# Patient Record
Sex: Female | Born: 1960
Health system: Southern US, Community
[De-identification: ages and names within clinical notes are randomized; demographics above are authoritative.]

## PROBLEM LIST (undated history)

## (undated) DIAGNOSIS — R519 Headache, unspecified: Secondary | ICD-10-CM

## (undated) DIAGNOSIS — R51 Headache: Secondary | ICD-10-CM

## (undated) DIAGNOSIS — I10 Essential (primary) hypertension: Secondary | ICD-10-CM

## (undated) DIAGNOSIS — G43909 Migraine, unspecified, not intractable, without status migrainosus: Secondary | ICD-10-CM

## (undated) DIAGNOSIS — N390 Urinary tract infection, site not specified: Secondary | ICD-10-CM

## (undated) DIAGNOSIS — B019 Varicella without complication: Secondary | ICD-10-CM

## (undated) DIAGNOSIS — Z9189 Other specified personal risk factors, not elsewhere classified: Secondary | ICD-10-CM

## (undated) HISTORY — DX: Other specified personal risk factors, not elsewhere classified: Z91.89

## (undated) HISTORY — DX: Headache: R51

## (undated) HISTORY — PX: SPINE SURGERY: SHX786

## (undated) HISTORY — DX: Headache, unspecified: R51.9

## (undated) HISTORY — DX: Urinary tract infection, site not specified: N39.0

## (undated) HISTORY — DX: Migraine, unspecified, not intractable, without status migrainosus: G43.909

## (undated) HISTORY — DX: Varicella without complication: B01.9

## (undated) HISTORY — DX: Essential (primary) hypertension: I10

---

## 2005-06-28 HISTORY — PX: NECK SURGERY: SHX720

## 2005-06-28 HISTORY — PX: BACK SURGERY: SHX140

## 2007-06-29 HISTORY — PX: BACK SURGERY: SHX140

## 2018-03-02 ENCOUNTER — Ambulatory Visit: Payer: Self-pay | Admitting: Family Medicine

## 2018-03-23 ENCOUNTER — Ambulatory Visit (INDEPENDENT_AMBULATORY_CARE_PROVIDER_SITE_OTHER): Payer: Medicare Other | Admitting: Family Medicine

## 2018-03-23 ENCOUNTER — Encounter: Payer: Self-pay | Admitting: Family Medicine

## 2018-03-23 VITALS — BP 133/86 | HR 78 | Temp 98.3°F | Ht 69.0 in | Wt 138.6 lb

## 2018-03-23 DIAGNOSIS — N3000 Acute cystitis without hematuria: Secondary | ICD-10-CM | POA: Diagnosis not present

## 2018-03-23 DIAGNOSIS — M545 Low back pain, unspecified: Secondary | ICD-10-CM | POA: Insufficient documentation

## 2018-03-23 DIAGNOSIS — E2740 Unspecified adrenocortical insufficiency: Secondary | ICD-10-CM | POA: Diagnosis not present

## 2018-03-23 DIAGNOSIS — G8929 Other chronic pain: Secondary | ICD-10-CM | POA: Diagnosis not present

## 2018-03-23 DIAGNOSIS — M47817 Spondylosis without myelopathy or radiculopathy, lumbosacral region: Secondary | ICD-10-CM | POA: Insufficient documentation

## 2018-03-23 DIAGNOSIS — Z23 Encounter for immunization: Secondary | ICD-10-CM | POA: Diagnosis not present

## 2018-03-23 HISTORY — DX: Spondylosis without myelopathy or radiculopathy, lumbosacral region: M47.817

## 2018-03-23 LAB — POCT URINALYSIS DIPSTICK
Bilirubin, UA: NEGATIVE
Blood, UA: NEGATIVE
Glucose, UA: NEGATIVE
Ketones, UA: NEGATIVE
Leukocytes, UA: NEGATIVE
Nitrite, UA: POSITIVE
PH UA: 6 (ref 5.0–8.0)
Protein, UA: NEGATIVE
Spec Grav, UA: 1.025 (ref 1.010–1.025)
UROBILINOGEN UA: 0.2 U/dL

## 2018-03-23 MED ORDER — SULFAMETHOXAZOLE-TRIMETHOPRIM 800-160 MG PO TABS: 1.0000 | ORAL_TABLET | Freq: Two times a day (BID) | ORAL | 0 refills | Status: AC

## 2018-03-23 NOTE — Patient Instructions (Addendum)
Stay hydrated.   Warning signs/symptoms: Uncontrollable nausea/vomiting, fevers, worsening symptoms despite treatment, confusion.  Give us around 2 business days to get culture back to you.  If you do not hear anything about your referral in the next 1-2 weeks, call our office and ask for an update.  Let us know if you need anything.  
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
## 2018-03-24 ENCOUNTER — Encounter: Payer: Self-pay | Admitting: Family Medicine

## 2018-03-24 ENCOUNTER — Other Ambulatory Visit (INDEPENDENT_AMBULATORY_CARE_PROVIDER_SITE_OTHER): Payer: Medicare Other

## 2018-03-24 DIAGNOSIS — E2740 Unspecified adrenocortical insufficiency: Secondary | ICD-10-CM | POA: Diagnosis not present

## 2018-03-24 LAB — CORTISOL: Cortisol, Plasma: 12.5 ug/dL

## 2018-03-25 LAB — URINE CULTURE
MICRO NUMBER: 91158355
SPECIMEN QUALITY: ADEQUATE

## 2018-03-27 ENCOUNTER — Other Ambulatory Visit: Payer: Self-pay | Admitting: Family Medicine

## 2018-03-27 MED ORDER — CEPHALEXIN 500 MG PO CAPS: 500.0000 mg | ORAL_CAPSULE | Freq: Two times a day (BID) | ORAL | 0 refills | Status: DC

## 2018-03-29 ENCOUNTER — Telehealth: Payer: Self-pay | Admitting: Family Medicine

## 2018-03-29 ENCOUNTER — Telehealth: Payer: Self-pay | Admitting: *Deleted

## 2018-03-29 NOTE — Telephone Encounter (Signed)
Pap smear 03/12/15.  DEXA  Mammogram- hx of breast cancer Colonoscopy 2017 I did not see any immunizations or HIV/Hep C screening info in her records. RE- please update her chart.

## 2018-03-29 NOTE — Telephone Encounter (Signed)
Received Medical records from [facility] [place]; forwarded to provider/SLS 10/02

## 2018-03-31 ENCOUNTER — Telehealth: Payer: Self-pay

## 2018-03-31 MED ORDER — MELOXICAM 15 MG PO TABS: 15.0000 mg | ORAL_TABLET | Freq: Every day | ORAL | 0 refills | Status: DC

## 2018-03-31 NOTE — Telephone Encounter (Signed)
Copied from CRM [id number]. Topic: General - Other >> Mar 24, 2018  1:10 PM Mcneil, Ja-Kwan wrote: Reason for CRM: Pt states she has been experiencing a headache which is a side effects of the sulfamethoxazole-trimethoprim (BACTRIM DS,SEPTRA DS) 800-160 MG tablet. Pt states it advises to contact doctor if side effects continue. Pt states she has not been able to get rid of the headache and she would like a call back from the doctor or nurse. Cb# [phone]

## 2018-03-31 NOTE — Addendum Note (Signed)
Addended by: Radene Gunning on: 03/31/2018 12:31 PM   Modules accepted: Orders

## 2018-03-31 NOTE — Telephone Encounter (Signed)
done

## 2018-03-31 NOTE — Telephone Encounter (Signed)
I will call something in to help with HA, if no improvement consider urgent care or following up in office on Mon. TY.

## 2018-03-31 NOTE — Telephone Encounter (Signed)
Patient informed of PCP instructions. 

## 2018-05-05 ENCOUNTER — Encounter: Payer: Self-pay | Admitting: Physical Medicine & Rehabilitation

## 2018-05-05 ENCOUNTER — Encounter: Payer: Medicare Other | Attending: Physical Medicine & Rehabilitation

## 2018-05-05 ENCOUNTER — Ambulatory Visit (HOSPITAL_BASED_OUTPATIENT_CLINIC_OR_DEPARTMENT_OTHER): Payer: Medicare Other | Admitting: Physical Medicine & Rehabilitation

## 2018-05-05 VITALS — BP 147/93 | HR 70 | Ht 69.0 in | Wt 141.6 lb

## 2018-05-05 DIAGNOSIS — F17210 Nicotine dependence, cigarettes, uncomplicated: Secondary | ICD-10-CM | POA: Insufficient documentation

## 2018-05-05 DIAGNOSIS — G243 Spasmodic torticollis: Secondary | ICD-10-CM | POA: Insufficient documentation

## 2018-05-05 DIAGNOSIS — M545 Low back pain: Secondary | ICD-10-CM | POA: Diagnosis not present

## 2018-05-05 DIAGNOSIS — M961 Postlaminectomy syndrome, not elsewhere classified: Secondary | ICD-10-CM | POA: Insufficient documentation

## 2018-05-05 DIAGNOSIS — G8929 Other chronic pain: Secondary | ICD-10-CM | POA: Insufficient documentation

## 2018-05-05 DIAGNOSIS — R200 Anesthesia of skin: Secondary | ICD-10-CM | POA: Diagnosis not present

## 2018-05-05 MED ORDER — GABAPENTIN 300 MG PO CAPS: 300.0000 mg | ORAL_CAPSULE | Freq: Three times a day (TID) | ORAL | 1 refills | Status: DC
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
## 2018-05-05 NOTE — Progress Notes (Signed)
Patient states wants to remain off narcotic pain medication.  No UDS, Opioid substance agreement,, Opioid Consent Form, or opioid score was accquired

## 2018-06-02 ENCOUNTER — Ambulatory Visit: Payer: Medicare Other | Admitting: Physical Medicine & Rehabilitation

## 2018-06-09 ENCOUNTER — Encounter: Payer: Self-pay | Admitting: Family Medicine

## 2018-06-09 ENCOUNTER — Ambulatory Visit (INDEPENDENT_AMBULATORY_CARE_PROVIDER_SITE_OTHER): Payer: Medicare Other | Admitting: Family Medicine

## 2018-06-09 VITALS — BP 108/64 | HR 89 | Temp 98.1°F | Ht 69.0 in | Wt 141.0 lb

## 2018-06-09 DIAGNOSIS — N3001 Acute cystitis with hematuria: Secondary | ICD-10-CM | POA: Insufficient documentation

## 2018-06-09 HISTORY — DX: Acute cystitis with hematuria: N30.01

## 2018-06-09 LAB — POC URINALSYSI DIPSTICK (AUTOMATED)
Bilirubin, UA: NEGATIVE
GLUCOSE UA: NEGATIVE
KETONES UA: NEGATIVE
Nitrite, UA: POSITIVE
Protein, UA: POSITIVE — AB
SPEC GRAV UA: 1.025 (ref 1.010–1.025)
Urobilinogen, UA: 0.2 E.U./dL
pH, UA: 6 (ref 5.0–8.0)

## 2018-06-09 MED ORDER — CEPHALEXIN 500 MG PO CAPS: 500.0000 mg | ORAL_CAPSULE | Freq: Two times a day (BID) | ORAL | 0 refills | Status: AC

## 2018-06-09 NOTE — Progress Notes (Signed)
Pre visit review using our clinic review tool, if applicable. No additional management support is needed unless otherwise documented below in the visit note. 

## 2018-06-09 NOTE — Patient Instructions (Signed)
Stay hydrated.   Warning signs/symptoms: Uncontrollable nausea/vomiting, fevers, worsening symptoms despite treatment, confusion.  Give us around 2 business days to get culture back to you.  Let us know if you need anything. 

## 2018-06-09 NOTE — Progress Notes (Signed)
Chief Complaint  Patient presents with  . Dysuria  . Urinary Frequency    [name] is a 57 y.o. female here for possible UTI.  Duration: 2 days. Symptoms: urinary frequency, hematuria, urinary retention, flank pain on L (typical w UTI's and dysuria Denies: fever, nausea, vomiting and abd pain, vaginal discharge Hx of recurrent UTI? Yes  ROS:  Constitutional: denies fever GU: As noted in HPI  Past Medical History:  Diagnosis Date  . Chicken pox   . Frequent headaches   . History of fainting spells of unknown cause   . Migraines   . Urinary tract infection     BP 108/64 (BP Location: Left Arm, Patient Position: Sitting, Cuff Size: Normal)   Pulse 89   Temp 98.1 F (36.7 C) (Oral)   Ht 5\' 9"  (1.753 m)   Wt 141 lb (64 kg)   LMP  (LMP Unknown)   SpO2 98%   BMI 20.82 kg/m  General: Awake, alert, appears stated age Heart: RRR Lungs: CTAB, normal respiratory effort, no accessory muscle usage Abd: BS+, soft, NT, ND, no masses or organomegaly MSK: No CVA tenderness, neg Lloyd's sign Psych: Age appropriate judgment and insight  Acute cystitis with hematuria - Plan: POCT Urinalysis Dipstick (Automated), Urine Culture, cephALEXin (KEFLEX) 500 MG capsule  Orders as above. Ck cx.  Stay hydrated. Seek immediate care if pt starts to develop fevers, new/worsening symptoms, uncontrollable N/V. F/u prn. The patient voiced understanding and agreement to the plan.  Jilda Rocheicholas Paul Mount MorrisWendling, DO 06/09/18 2:29 PM

## 2018-06-10 LAB — URINE CULTURE
MICRO NUMBER:: 91495518
SPECIMEN QUALITY:: ADEQUATE

## 2018-06-27 NOTE — Progress Notes (Signed)
Subjective:   Brenda Curry is a 57 y.o. female who presents for an Initial Medicare Annual Wellness Visit.  Review of Systems   No ROS.  Medicare Wellness Visit. Additional risk factors are reflected in the social history. Cardiac Risk Factors include: advanced age (>18men, >98 women) Sleep patterns: very restless sleep.  Home Safety/Smoke Alarms: Feels safe in home. Smoke alarms in place. Lives in 2 story home. Nephew lives with her.     Female:   Pap- will discuss with PCP    Mammo- order             CCS- Will order Cologuard at pt request     Objective:    Today's Vitals   06/29/18 1008  BP: 110/60  Pulse: 80  SpO2: 98%  Weight: 138 lb 12.8 oz (63 kg)  Height: 5\' 9"  (1.753 m)   Body mass index is 20.5 kg/m.  Advanced Directives 06/29/2018  Does Patient Have a Medical Advance Directive? No  Would patient like information on creating a medical advance directive? No - Patient declined    Current Medications (verified) Outpatient Encounter Medications as of 06/29/2018  Medication Sig  . meloxicam (MOBIC) 15 MG tablet Take 1 tablet (15 mg total) by mouth daily.  Marland Kitchen gabapentin (NEURONTIN) 300 MG capsule Take 1 capsule (300 mg total) by mouth 3 (three) times daily.  Marland Kitchen ibuprofen (ADVIL,MOTRIN) 200 MG tablet Take 200 mg by mouth every 6 (six) hours as needed.   No facility-administered encounter medications on file as of 06/29/2018.     Allergies (verified) Macrobid [nitrofurantoin macrocrystal] and Tape   History: Past Medical History:  Diagnosis Date  . Chicken pox   . Frequent headaches   . History of fainting spells of unknown cause   . Migraines   . Urinary tract infection    Past Surgical History:  Procedure Laterality Date  . BACK SURGERY  2007  . BACK SURGERY  2009  . NECK SURGERY  2007   Family History  Problem Relation Age of Onset  . Hearing loss Mother   . Cancer Father   . Heart attack Father   . Heart disease Father   . High Cholesterol  Father   . Hypertension Father   . Heart disease Brother   . Diabetes Maternal Grandmother   . Alcohol abuse Paternal Grandfather   . Cancer Paternal Grandfather   . Heart disease Paternal Grandfather    Social History   Socioeconomic History  . Marital status: Divorced    Spouse name: Not on file  . Number of children: Not on file  . Years of education: Not on file  . Highest education level: Not on file  Occupational History  . Not on file  Social Needs  . Financial resource strain: Not on file  . Food insecurity:    Worry: Not on file    Inability: Not on file  . Transportation needs:    Medical: Not on file    Non-medical: Not on file  Tobacco Use  . Smoking status: Current Some Day Smoker    Packs/day: 0.13    Years: 36.00    Pack years: 4.68    Types: Cigarettes  . Smokeless tobacco: Never Used  Substance and Sexual Activity  . Alcohol use: Yes    Comment: rarely  . Drug use: Never  . Sexual activity: Not Currently  Lifestyle  . Physical activity:    Days per week: Not on file  Minutes per session: Not on file  . Stress: Not on file  Relationships  . Social connections:    Talks on phone: Not on file    Gets together: Not on file    Attends religious service: Not on file    Active member of club or organization: Not on file    Attends meetings of clubs or organizations: Not on file    Relationship status: Not on file  Other Topics Concern  . Not on file  Social History Narrative  . Not on file    Tobacco Counseling Ready to quit: Not Answered Counseling given: Not Answered   Clinical Intake: Pain : No/denies pain   Activities of Daily Living In your present state of health, do you have any difficulty performing the following activities: 06/29/2018  Hearing? N  Vision? N  Difficulty concentrating or making decisions? N  Walking or climbing stairs? N  Dressing or bathing? N  Doing errands, shopping? N  Preparing Food and eating ? N  Using  the Toilet? N  In the past six months, have you accidently leaked urine? N  Do you have problems with loss of bowel control? N  Managing your Medications? N  Managing your Finances? N  Housekeeping or managing your Housekeeping? N     Immunizations and Health Maintenance Immunization History  Administered Date(s) Administered  . Influenza,inj,Quad PF,6+ Mos 03/23/2018   Health Maintenance Due  Topic Date Due  . Hepatitis C Screening  1960/09/05  . HIV Screening  07/06/1976  . TETANUS/TDAP  07/06/1980  . MAMMOGRAM  07/07/2011  . PAP SMEAR-Modifier  03/11/2018    Patient Care Team: Sharlene DoryWendling, Nicholas Paul, DO as PCP - General (Family Medicine)  Indicate any recent Medical Services you may have received from other than [facility] providers in the past year (date may be approximate).     Assessment:   This is a routine wellness examination for Brenda Curry, Brenda Curry. Physical assessment deferred to PCP.  Hearing/Vision screen Hearing Screening Comments: Able to hear conversational tones w/o difficulty. No issues reported.    Vision Screening Comments: Pt declines. States her glasses are in the car.  Dietary issues and exercise activities discussed: Current Exercise Habits: Home exercise routine, Type of exercise: walking, Time (Minutes): 30, Frequency (Times/Week): 3, Weekly Exercise (Minutes/Week): 90, Exercise limited by: orthopedic condition(s) Diet (meal preparation, eat out, water intake, caffeinated beverages, dairy products, fruits and vegetables): 24 hour recall Breakfast: bagel and coffee Lunch: sandwich and chips. Water. Dinner:  Soups or pasta. Water. Very rare soda.  Goals    . Increase physical activity      Depression Screen PHQ 2/9 Scores 06/29/2018 05/05/2018  PHQ - 2 Score 4 4  PHQ- 9 Score 11 14    Fall Risk Fall Risk  06/29/2018 05/05/2018  Falls in the past year? 0 0    Cognitive Function: MMSE - Mini Mental State Exam 06/29/2018  Orientation to time 5  Orientation  to Place 5  Registration 3  Attention/ Calculation 5  Recall 1  Language- name 2 objects 2  Language- repeat 1  Language- follow 3 step command 3  Language- read & follow direction 1  Write a sentence 1  Copy design 1  Total score 28        Screening Tests Health Maintenance  Topic Date Due  . Hepatitis C Screening  1960/09/05  . HIV Screening  07/06/1976  . TETANUS/TDAP  07/06/1980  . MAMMOGRAM  07/07/2011  . PAP SMEAR-Modifier  03/11/2018  . COLONOSCOPY  07/29/2025  . INFLUENZA VACCINE  Completed      Plan:    Please schedule your next medicare wellness visit with me in 1 yr.  Continue to eat heart healthy diet (full of fruits, vegetables, whole grains, lean protein, water--limit salt, fat, and sugar intake) and increase physical activity as tolerated.  Continue doing brain stimulating activities (puzzles, reading, adult coloring books, staying active) to keep memory sharp.   I have ordered your mammogram and Cologuard. Please complete.  I have personally reviewed and noted the following in the patient's chart:   . Medical and social history . Use of alcohol, tobacco or illicit drugs  . Current medications and supplements . Functional ability and status . Nutritional status . Physical activity . Advanced directives . List of other physicians . Hospitalizations, surgeries, and ER visits in previous 12 months . Vitals . Screenings to include cognitive, depression, and falls . Referrals and appointments  In addition, I have reviewed and discussed with patient certain preventive protocols, quality metrics, and best practice recommendations. A written personalized care plan for preventive services as well as general preventive health recommendations were provided to patient.     Avon GullyBritt, Adriauna Campton Angel, CaliforniaRN   06/29/2018

## 2018-06-29 ENCOUNTER — Encounter: Payer: Self-pay | Admitting: Family Medicine

## 2018-06-29 ENCOUNTER — Ambulatory Visit (INDEPENDENT_AMBULATORY_CARE_PROVIDER_SITE_OTHER): Payer: Medicare Other | Admitting: *Deleted

## 2018-06-29 ENCOUNTER — Encounter: Payer: Self-pay | Admitting: *Deleted

## 2018-06-29 ENCOUNTER — Ambulatory Visit (INDEPENDENT_AMBULATORY_CARE_PROVIDER_SITE_OTHER): Payer: Medicare Other | Admitting: Family Medicine

## 2018-06-29 VITALS — BP 110/60 | HR 80 | Ht 69.0 in | Wt 138.8 lb

## 2018-06-29 VITALS — BP 110/60 | HR 90 | Ht 69.0 in | Wt 138.5 lb

## 2018-06-29 DIAGNOSIS — Z0000 Encounter for general adult medical examination without abnormal findings: Secondary | ICD-10-CM | POA: Diagnosis not present

## 2018-06-29 DIAGNOSIS — R030 Elevated blood-pressure reading, without diagnosis of hypertension: Secondary | ICD-10-CM | POA: Diagnosis not present

## 2018-06-29 DIAGNOSIS — Z23 Encounter for immunization: Secondary | ICD-10-CM | POA: Diagnosis not present

## 2018-06-29 DIAGNOSIS — Z1211 Encounter for screening for malignant neoplasm of colon: Secondary | ICD-10-CM

## 2018-06-29 DIAGNOSIS — Z1231 Encounter for screening mammogram for malignant neoplasm of breast: Secondary | ICD-10-CM

## 2018-06-29 DIAGNOSIS — Z720 Tobacco use: Secondary | ICD-10-CM

## 2018-06-29 LAB — COMPREHENSIVE METABOLIC PANEL
ALBUMIN: 4 g/dL (ref 3.5–5.2)
ALK PHOS: 55 U/L (ref 39–117)
ALT: 20 U/L (ref 0–35)
AST: 19 U/L (ref 0–37)
BILIRUBIN TOTAL: 0.5 mg/dL (ref 0.2–1.2)
BUN: 15 mg/dL (ref 6–23)
CALCIUM: 9.6 mg/dL (ref 8.4–10.5)
CO2: 28 mEq/L (ref 19–32)
Chloride: 103 mEq/L (ref 96–112)
Creatinine, Ser: 0.72 mg/dL (ref 0.40–1.20)
GFR: 88.74 mL/min (ref >60.00)
Glucose, Bld: 96 mg/dL (ref 70–99)
Potassium: 4.6 mEq/L (ref 3.5–5.1)
Sodium: 137 mEq/L (ref 135–145)
TOTAL PROTEIN: 6 g/dL (ref 6.0–8.3)

## 2018-06-29 LAB — LIPID PANEL
CHOLESTEROL: 198 mg/dL (ref 0–200)
HDL: 64.5 mg/dL (ref >39.00)
LDL Cholesterol: 111 mg/dL — ABNORMAL HIGH (ref 0–99)
NonHDL: 133.39
TRIGLYCERIDES: 110 mg/dL (ref 0.0–149.0)
Total CHOL/HDL Ratio: 3
VLDL: 22 mg/dL (ref 0.0–40.0)

## 2018-06-29 NOTE — Patient Instructions (Signed)
Please schedule your next medicare wellness visit with me in 1 yr.  Continue to eat heart healthy diet (full of fruits, vegetables, whole grains, lean protein, water--limit salt, fat, and sugar intake) and increase physical activity as tolerated.  Continue doing brain stimulating activities (puzzles, reading, adult coloring books, staying active) to keep memory sharp.   I have ordered your mammogram and Cologuard. Please complete.   [name] , Thank you for taking time to come for your Medicare Wellness Visit. I appreciate your ongoing commitment to your health goals. Please review the following plan we discussed and let me know if I can assist you in the future.   These are the goals we discussed: Goals    . Increase physical activity       This is a list of the screening recommended for you and due dates:  Health Maintenance  Topic Date Due  .  Hepatitis C: One time screening is recommended by Center for Disease Control  (CDC) for  adults born from [birth year] through 1964.   August 30, 1960  . HIV Screening  07/06/1976  . Tetanus Vaccine  07/06/1980  . Mammogram  07/07/2011  . Pap Smear  03/11/2018  . Colon Cancer Screening  07/29/2025  . Flu Shot  Completed    Health Maintenance for Postmenopausal Women Menopause is a normal process in which your reproductive ability comes to an end. This process happens gradually over a span of months to years, usually between the ages of 39 and 12. Menopause is complete when you have missed 12 consecutive menstrual periods. It is important to talk with your health care provider about some of the most common conditions that affect postmenopausal women, such as heart disease, cancer, and bone loss (osteoporosis). Adopting a healthy lifestyle and getting preventive care can help to promote your health and wellness. Those actions can also lower your chances of developing some of these common conditions. What should I know about menopause? During  menopause, you may experience a number of symptoms, such as:  Moderate-to-severe hot flashes.  Night sweats.  Decrease in sex drive.  Mood swings.  Headaches.  Tiredness.  Irritability.  Memory problems.  Insomnia. Choosing to treat or not to treat menopausal changes is an individual decision that you make with your health care provider. What should I know about hormone replacement therapy and supplements? Hormone therapy products are effective for treating symptoms that are associated with menopause, such as hot flashes and night sweats. Hormone replacement carries certain risks, especially as you become older. If you are thinking about using estrogen or estrogen with progestin treatments, discuss the benefits and risks with your health care provider. What should I know about heart disease and stroke? Heart disease, heart attack, and stroke become more likely as you age. This may be due, in part, to the hormonal changes that your body experiences during menopause. These can affect how your body processes dietary fats, triglycerides, and cholesterol. Heart attack and stroke are both medical emergencies. There are many things that you can do to help prevent heart disease and stroke:  Have your blood pressure checked at least every 1-2 years. High blood pressure causes heart disease and increases the risk of stroke.  If you are 57-41 years old, ask your health care provider if you should take aspirin to prevent a heart attack or a stroke.  Do not use any tobacco products, including cigarettes, chewing tobacco, or electronic cigarettes. If you need help quitting, ask your health care  provider.  It is important to eat a healthy diet and maintain a healthy weight. ? Be sure to include plenty of vegetables, fruits, low-fat dairy products, and lean protein. ? Avoid eating foods that are high in solid fats, added sugars, or salt (sodium).  Get regular exercise. This is one of the most  important things that you can do for your health. ? Try to exercise for at least 150 minutes each week. The type of exercise that you do should increase your heart rate and make you sweat. This is known as moderate-intensity exercise. ? Try to do strengthening exercises at least twice each week. Do these in addition to the moderate-intensity exercise.  Know your numbers.Ask your health care provider to check your cholesterol and your blood glucose. Continue to have your blood tested as directed by your health care provider.  What should I know about cancer screening? There are several types of cancer. Take the following steps to reduce your risk and to catch any cancer development as early as possible. Breast Cancer  Practice breast self-awareness. ? This means understanding how your breasts normally appear and feel. ? It also means doing regular breast self-exams. Let your health care provider know about any changes, no matter how small.  If you are 40 or older, have a clinician do a breast exam (clinical breast exam or CBE) every year. Depending on your age, family history, and medical history, it may be recommended that you also have a yearly breast X-ray (mammogram).  If you have a family history of breast cancer, talk with your health care provider about genetic screening.  If you are at high risk for breast cancer, talk with your health care provider about having an MRI and a mammogram every year.  Breast cancer (BRCA) gene test is recommended for women who have family members with BRCA-related cancers. Results of the assessment will determine the need for genetic counseling and BRCA1 and for BRCA2 testing. BRCA-related cancers include these types: ? Breast. This occurs in males or females. ? Ovarian. ? Tubal. This may also be called fallopian tube cancer. ? Cancer of the abdominal or pelvic lining (peritoneal cancer). ? Prostate. ? Pancreatic. Cervical, Uterine, and Ovarian  Cancer Your health care provider may recommend that you be screened regularly for cancer of the pelvic organs. These include your ovaries, uterus, and vagina. This screening involves a pelvic exam, which includes checking for microscopic changes to the surface of your cervix (Pap test).  For women ages 21-65, health care providers may recommend a pelvic exam and a Pap test every three years. For women ages 39-65, they may recommend the Pap test and pelvic exam, combined with testing for human papilloma virus (HPV), every five years. Some types of HPV increase your risk of cervical cancer. Testing for HPV may also be done on women of any age who have unclear Pap test results.  Other health care providers may not recommend any screening for nonpregnant women who are considered low risk for pelvic cancer and have no symptoms. Ask your health care provider if a screening pelvic exam is right for you.  If you have had past treatment for cervical cancer or a condition that could lead to cancer, you need Pap tests and screening for cancer for at least 20 years after your treatment. If Pap tests have been discontinued for you, your risk factors (such as having a new sexual partner) need to be reassessed to determine if you should start having  screenings again. Some women have medical problems that increase the chance of getting cervical cancer. In these cases, your health care provider may recommend that you have screening and Pap tests more often.  If you have a family history of uterine cancer or ovarian cancer, talk with your health care provider about genetic screening.  If you have vaginal bleeding after reaching menopause, tell your health care provider.  There are currently no reliable tests available to screen for ovarian cancer. Lung Cancer Lung cancer screening is recommended for adults 27-71 years old who are at high risk for lung cancer because of a history of smoking. A yearly low-dose CT scan of  the lungs is recommended if you:  Currently smoke.  Have a history of at least 30 pack-years of smoking and you currently smoke or have quit within the past 15 years. A pack-year is smoking an average of one pack of cigarettes per day for one year. Yearly screening should:  Continue until it has been 15 years since you quit.  Stop if you develop a health problem that would prevent you from having lung cancer treatment. Colorectal Cancer  This type of cancer can be detected and can often be prevented.  Routine colorectal cancer screening usually begins at age 1 and continues through age 13.  If you have risk factors for colon cancer, your health care provider may recommend that you be screened at an earlier age.  If you have a family history of colorectal cancer, talk with your health care provider about genetic screening.  Your health care provider may also recommend using home test kits to check for hidden blood in your stool.  A small camera at the end of a tube can be used to examine your colon directly (sigmoidoscopy or colonoscopy). This is done to check for the earliest forms of colorectal cancer.  Direct examination of the colon should be repeated every 5-10 years until age 43. However, if early forms of precancerous polyps or small growths are found or if you have a family history or genetic risk for colorectal cancer, you may need to be screened more often. Skin Cancer  Check your skin from head to toe regularly.  Monitor any moles. Be sure to tell your health care provider: ? About any new moles or changes in moles, especially if there is a change in a mole's shape or color. ? If you have a mole that is larger than the size of a pencil eraser.  If any of your family members has a history of skin cancer, especially at a young age, talk with your health care provider about genetic screening.  Always use sunscreen. Apply sunscreen liberally and repeatedly throughout the  day.  Whenever you are outside, protect yourself by wearing long sleeves, pants, a wide-brimmed hat, and sunglasses. What should I know about osteoporosis? Osteoporosis is a condition in which bone destruction happens more quickly than new bone creation. After menopause, you may be at an increased risk for osteoporosis. To help prevent osteoporosis or the bone fractures that can happen because of osteoporosis, the following is recommended:  If you are 39-31 years old, get at least 1,000 mg of calcium and at least 600 mg of vitamin D per day.  If you are older than age 10 but younger than age 52, get at least 1,200 mg of calcium and at least 600 mg of vitamin D per day.  If you are older than age 24, get at least 4,200  mg of calcium and at least 800 mg of vitamin D per day. Smoking and excessive alcohol intake increase the risk of osteoporosis. Eat foods that are rich in calcium and vitamin D, and do weight-bearing exercises several times each week as directed by your health care provider. What should I know about how menopause affects my mental health? Depression may occur at any age, but it is more common as you become older. Common symptoms of depression include:  Low or sad mood.  Changes in sleep patterns.  Changes in appetite or eating patterns.  Feeling an overall lack of motivation or enjoyment of activities that you previously enjoyed.  Frequent crying spells. Talk with your health care provider if you think that you are experiencing depression. What should I know about immunizations? It is important that you get and maintain your immunizations. These include:  Tetanus, diphtheria, and pertussis (Tdap) booster vaccine.  Influenza every year before the flu season begins.  Pneumonia vaccine.  Shingles vaccine. Your health care provider may also recommend other immunizations. This information is not intended to replace advice given to you by your health care provider. Make  sure you discuss any questions you have with your health care provider. Document Released: 08/06/2005 Document Revised: 01/02/2016 Document Reviewed: 03/18/2015 Elsevier Interactive Patient Education  2019 [employer].

## 2018-06-29 NOTE — Progress Notes (Signed)
Noted. Agree with above.  Jilda Roche [place], DO 06/29/18 12:08 PM

## 2018-06-29 NOTE — Progress Notes (Signed)
Chief Complaint  Patient presents with  . Annual Exam    Subjective: Patient is a 58 y.o. female here for CPE, but she has Medicare, just had AWV.  Patient does smoke 1-2 cigarettes a day.  She is working to cut down.  She also has a history of chronic pain and takes anti-inflammatories frequently.  She would like labs today.  Reports that a few weeks ago, she had a 2-week span where her blood pressures were elevated in the mid to high 100s over 90s-100s.  Since that time, they have returned to normal levels similar to today's reading.  There was no change in physical activity or eating.  No chest pain or shortness of breath.  ROS: Heart: Denies chest pain  Lungs: Denies SOB   Past Medical History:  Diagnosis Date  . Chicken pox   . Frequent headaches   . History of fainting spells of unknown cause   . Migraines   . Urinary tract infection     Objective: BP 110/60 (BP Location: Left Arm, Patient Position: Sitting, Cuff Size: Normal)   Pulse 90   Ht 5\' 9"  (1.753 m)   Wt 138 lb 8 oz (62.8 kg)   LMP  (LMP Unknown)   SpO2 98%   BMI 20.45 kg/m  General: Awake, appears stated age HEENT: MMM, EOMi Heart: RRR, no bruits, no lower extremity edema Lungs: CTAB, no rales, wheezes or rhonchi. No accessory muscle use Psych: Age appropriate judgment and insight, normal affect and mood  Assessment and Plan: Elevated blood pressure reading - Plan: Comprehensive metabolic panel  Tobacco abuse - Plan: Lipid panel  Need for tetanus booster - Plan: Tdap vaccine greater than or equal to 7yo IM  Blood pressure is good today.  On recheck it was 122/72. If it elevates again, will request she brings her monitor and comes in for an appointment. Follow-up as needed at this point. The patient voiced understanding and agreement to the plan.  Jilda Roche [place], DO 06/29/18  12:02 PM

## 2018-06-29 NOTE — Patient Instructions (Addendum)
Call [facility] [place] at [phone] for an appointment.  They are located at [address], [address], [place], [place], [address] (right across the hall from our office).  Give us 2-3 business days to get the results of your labs back.   Keep the diet clean and stay active.  Let us know if you need anything. 

## 2019-05-13 DIAGNOSIS — Z20828 Contact with and (suspected) exposure to other viral communicable diseases: Secondary | ICD-10-CM | POA: Diagnosis not present

## 2019-07-04 ENCOUNTER — Encounter: Payer: Medicare Other | Admitting: Family Medicine

## 2019-07-04 NOTE — Progress Notes (Addendum)
Virtual Visit via Video Note  I connected with patient on 07/05/19 at  9:00 AM EST by audio enabled telemedicine application and verified that I am speaking with the correct person using two identifiers.   THIS ENCOUNTER IS A VIRTUAL VISIT DUE TO COVID-19 - PATIENT WAS NOT SEEN IN THE OFFICE. PATIENT HAS CONSENTED TO VIRTUAL VISIT / TELEMEDICINE VISIT   Location of patient: home  Location of provider: office  I discussed the limitations of evaluation and management by telemedicine and the availability of in person appointments. The patient expressed understanding and agreed to proceed.   Subjective:   Brenda Curry is a 59 y.o. female who presents for Medicare Annual (Subsequent) preventive examination.  Review of Systems:  Cardiac Risk Factors include: advanced age (>30men, >23 women) Home Safety/Smoke Alarms: Feels safe in home. Smoke alarms in place.  Lives alone w/ 2 dogs in 2 story home. Pt reports she does well w/ stairs using rail. Nephew lives with her.    Female:   Pap-  Pt states scheduled 07/09/19.     Mammo- scheduled 08/09/19           CCS- pt states she will discuss PCP.    Objective:     Vitals: LMP  (LMP Unknown)   There is no height or weight on file to calculate BMI.  Advanced Directives 07/05/2019 06/29/2018  Does Patient Have a Medical Advance Directive? No No  Would patient like information on creating a medical advance directive? No - Patient declined No - Patient declined    Tobacco Social History   Tobacco Use  Smoking Status Former Smoker   Years: 36.00   Types: Cigarettes   Quit date: 09/27/2018   Years since quitting: 0.7  Smokeless Tobacco Never Used     Counseling given: Not Answered   Clinical Intake: Pain : No/denies pain     Past Medical History:  Diagnosis Date   Chicken pox    Frequent headaches    History of fainting spells of unknown cause    Migraines    Urinary tract infection    Past Surgical History:  Procedure  Laterality Date   BACK SURGERY  2007   BACK SURGERY  2009   NECK SURGERY  2007   Family History  Problem Relation Age of Onset   Hearing loss Mother    Cancer Father    Heart attack Father    Heart disease Father    High Cholesterol Father    Hypertension Father    Heart disease Brother    Diabetes Maternal Grandmother    Alcohol abuse Paternal Grandfather    Cancer Paternal Grandfather    Heart disease Paternal Grandfather    Social History   Socioeconomic History   Marital status: Divorced    Spouse name: Not on file   Number of children: Not on file   Years of education: Not on file   Highest education level: Not on file  Occupational History   Not on file  Tobacco Use   Smoking status: Former Smoker    Years: 36.00    Types: Cigarettes    Quit date: 09/27/2018    Years since quitting: 0.7   Smokeless tobacco: Never Used  Substance and Sexual Activity   Alcohol use: Yes    Comment: rarely   Drug use: Never   Sexual activity: Not Currently  Other Topics Concern   Not on file  Social History Narrative   Not on file  Social Determinants of Health   Financial Resource Strain:    Difficulty of Paying Living Expenses: Not on file  Food Insecurity:    Worried About Charity fundraiser in the Last Year: Not on file   [employer] of Food in the Last Year: Not on file  Transportation Needs:    Lack of Transportation (Medical): Not on file   Lack of Transportation (Non-Medical): Not on file  Physical Activity:    Days of Exercise per Week: Not on file   Minutes of Exercise per Session: Not on file  Stress:    Feeling of Stress : Not on file  Social Connections:    Frequency of Communication with Friends and Family: Not on file   Frequency of Social Gatherings with Friends and Family: Not on file   Attends Religious Services: Not on file   Active Member of Clubs or Organizations: Not on file   Attends Club or Organization Meetings: Not on file   Marital Status:  Not on file    Outpatient Encounter Medications as of 07/05/2019  Medication Sig   gabapentin (NEURONTIN) 300 MG capsule Take 1 capsule (300 mg total) by mouth 3 (three) times daily.   ibuprofen (ADVIL,MOTRIN) 200 MG tablet Take 200 mg by mouth every 6 (six) hours as needed.   meloxicam (MOBIC) 15 MG tablet Take 1 tablet (15 mg total) by mouth daily.   No facility-administered encounter medications on file as of 07/05/2019.    Activities of Daily Living In your present state of health, do you have any difficulty performing the following activities: 07/05/2019  Hearing? N  Vision? N  Difficulty concentrating or making decisions? N  Walking or climbing stairs? N  Dressing or bathing? N  Doing errands, shopping? N  Preparing Food and eating ? N  Using the Toilet? N  In the past six months, have you accidently leaked urine? N  Do you have problems with loss of bowel control? N  Managing your Medications? N  Managing your Finances? N  Housekeeping or managing your Housekeeping? N  Some recent data might be hidden    Patient Care Team: Shelda Pal, DO as PCP - General (Family Medicine)    Assessment:   This is a routine wellness examination for Brenda Curry. Physical assessment deferred to PCP.  Exercise Activities and Dietary recommendations Current Exercise Habits: Home exercise routine, Type of exercise: walking, Time (Minutes): 15, Frequency (Times/Week): 4, Weekly Exercise (Minutes/Week): 60, Intensity: Mild, Exercise limited by: None identified Diet (meal preparation, eat out, water intake, caffeinated beverages, dairy products, fruits and vegetables): in general, a "healthy" diet  , well balanced Breakfast: coffee and boiled egg Lunch: soup, sandwich water Dinner:   Soup, sandwich, drink and chips  Goals      Increase physical activity        Fall Risk Fall Risk  07/05/2019 06/29/2018 05/05/2018  Falls in the past year? 0 0 0  Follow up Education provided;Falls  prevention discussed - -   Depression Screen PHQ 2/9 Scores 07/05/2019 06/29/2018 05/05/2018  PHQ - 2 Score 1 4 4   PHQ- 9 Score - 11 14     Cognitive Function Ad8 score reviewed for issues: Issues making decisions:no Less interest in hobbies / activities:no Repeats questions, stories (family complaining):no Trouble using ordinary gadgets (microwave, computer, phone):no Forgets the month or year: no Mismanaging finances: no Remembering appts:no Daily problems with thinking and/or memory:no Ad8 score is=0    MMSE - Mini Mental State  Exam 06/29/2018  Orientation to time 5  Orientation to Place 5  Registration 3  Attention/ Calculation 5  Recall 1  Language- name 2 objects 2  Language- repeat 1  Language- follow 3 step command 3  Language- read & follow direction 1  Write a sentence 1  Copy design 1  Total score 28        Immunization History  Administered Date(s) Administered   Influenza,inj,Quad PF,6+ Mos 03/23/2018   Tdap 06/29/2018    Screening Tests Health Maintenance  Topic Date Due   Hepatitis C Screening  [birth year]   HIV Screening  07/06/1976   MAMMOGRAM  07/07/2011   PAP SMEAR-Modifier  03/11/2018   INFLUENZA VACCINE  01/27/2019   COLONOSCOPY  07/29/2025   TETANUS/TDAP  06/29/2028        Plan:   See you next year!  Continue to eat heart healthy diet (full of fruits, vegetables, whole grains, lean protein, water--limit salt, fat, and sugar intake) and increase physical activity as tolerated.  Continue doing brain stimulating activities (puzzles, reading, adult coloring books, staying active) to keep memory sharp.   Bring a copy of your living will and/or healthcare power of attorney to your next office visit.   I have personally reviewed and noted the following in the patient's chart:   Medical and social history Use of alcohol, tobacco or illicit drugs  Current medications and supplements Functional ability and status Nutritional status  Physical activity Advanced directives List of other physicians Hospitalizations, surgeries, and ER visits in previous 12 months Vitals Screenings to include cognitive, depression, and falls Referrals and appointments  In addition, I have reviewed and discussed with patient certain preventive protocols, quality metrics, and best practice recommendations. A written personalized care plan for preventive services as well as general preventive health recommendations were provided to patient.     [place]on G[place], [place]  07/05/2019

## 2019-07-05 ENCOUNTER — Encounter: Payer: Medicare Other | Admitting: Family Medicine

## 2019-07-05 ENCOUNTER — Ambulatory Visit (INDEPENDENT_AMBULATORY_CARE_PROVIDER_SITE_OTHER): Payer: Medicare Other | Admitting: *Deleted

## 2019-07-05 ENCOUNTER — Other Ambulatory Visit: Payer: Self-pay

## 2019-07-05 ENCOUNTER — Encounter: Payer: Self-pay | Admitting: *Deleted

## 2019-07-05 DIAGNOSIS — Z0000 Encounter for general adult medical examination without abnormal findings: Secondary | ICD-10-CM | POA: Diagnosis not present

## 2019-07-05 NOTE — Patient Instructions (Signed)
See you next year!  Continue to eat heart healthy diet (full of fruits, vegetables, whole grains, lean protein, water--limit salt, fat, and sugar intake) and increase physical activity as tolerated.  Continue doing brain stimulating activities (puzzles, reading, adult coloring books, staying active) to keep memory sharp.   Bring a copy of your living will and/or healthcare power of attorney to your next office visit.   Brenda Curry , Thank you for taking time to come for your Medicare Wellness Visit. I appreciate your ongoing commitment to your health goals. Please review the following plan we discussed and let me know if I can assist you in the future.   These are the goals we discussed: Goals    . Increase physical activity       This is a list of the screening recommended for you and due dates:  Health Maintenance  Topic Date Due  .  Hepatitis C: One time screening is recommended by Center for Disease Control  (CDC) for  adults born from [birth year] through 1964.   [birth year]  . HIV Screening  07/06/1976  . Mammogram  07/07/2011  . Pap Smear  03/11/2018  . Flu Shot  01/27/2019  . Colon Cancer Screening  07/29/2025  . Tetanus Vaccine  06/29/2028    Preventive Care 83-43 Years Old, Female Preventive care refers to visits with your health care provider and lifestyle choices that can promote health and wellness. This includes:  A yearly physical exam. This may also be called an annual well check.  Regular dental visits and eye exams.  Immunizations.  Screening for certain conditions.  Healthy lifestyle choices, such as eating a healthy diet, getting regular exercise, not using drugs or products that contain nicotine and tobacco, and limiting alcohol use. What can I expect for my preventive care visit? Physical exam Your health care provider will check your:  Height and weight. This may be used to calculate body mass index (BMI), which tells if you are at a healthy weight.  Heart  rate and blood pressure.  Skin for abnormal spots. Counseling Your health care provider may ask you questions about your:  Alcohol, tobacco, and drug use.  Emotional well-being.  Home and relationship well-being.  Sexual activity.  Eating habits.  Work and work Statistician.  Method of birth control.  Menstrual cycle.  Pregnancy history. What immunizations do I need?  Influenza (flu) vaccine  This is recommended every year. Tetanus, diphtheria, and pertussis (Tdap) vaccine  You may need a Td booster every 10 years. Varicella (chickenpox) vaccine  You may need this if you have not been vaccinated. Zoster (shingles) vaccine  You may need this after age 3. Measles, mumps, and rubella (MMR) vaccine  You may need at least one dose of MMR if you were born in 1957 or later. You may also need a second dose. Pneumococcal conjugate (PCV13) vaccine  You may need this if you have certain conditions and were not previously vaccinated. Pneumococcal polysaccharide (PPSV23) vaccine  You may need one or two doses if you smoke cigarettes or if you have certain conditions. Meningococcal conjugate (MenACWY) vaccine  You may need this if you have certain conditions. Hepatitis A vaccine  You may need this if you have certain conditions or if you travel or work in places where you may be exposed to hepatitis A. Hepatitis B vaccine  You may need this if you have certain conditions or if you travel or work in places where you may be exposed  to hepatitis B. Haemophilus influenzae type b (Hib) vaccine  You may need this if you have certain conditions. Human papillomavirus (HPV) vaccine  If recommended by your health care provider, you may need three doses over 6 months. You may receive vaccines as individual doses or as more than one vaccine together in one shot (combination vaccines). Talk with your health care provider about the risks and benefits of combination vaccines. What  tests do I need? Blood tests  Lipid and cholesterol levels. These may be checked every 5 years, or more frequently if you are over 68 years old.  Hepatitis C test.  Hepatitis B test. Screening  Lung cancer screening. You may have this screening every year starting at age 47 if you have a 30-pack-year history of smoking and currently smoke or have quit within the past 15 years.  Colorectal cancer screening. All adults should have this screening starting at age 49 and continuing until age 49. Your health care provider may recommend screening at age 33 if you are at increased risk. You will have tests every 1-10 years, depending on your results and the type of screening test.  Diabetes screening. This is done by checking your blood sugar (glucose) after you have not eaten for a while (fasting). You may have this done every 1-3 years.  Mammogram. This may be done every 1-2 years. Talk with your health care provider about when you should start having regular mammograms. This may depend on whether you have a family history of breast cancer.  BRCA-related cancer screening. This may be done if you have a family history of breast, ovarian, tubal, or peritoneal cancers.  Pelvic exam and Pap test. This may be done every 3 years starting at age 50. Starting at age 34, this may be done every 5 years if you have a Pap test in combination with an HPV test. Other tests  Sexually transmitted disease (STD) testing.  Bone density scan. This is done to screen for osteoporosis. You may have this scan if you are at high risk for osteoporosis. Follow these instructions at home: Eating and drinking  Eat a diet that includes fresh fruits and vegetables, whole grains, lean protein, and low-fat dairy.  Take vitamin and mineral supplements as recommended by your health care provider.  Do not drink alcohol if: ? Your health care provider tells you not to drink. ? You are pregnant, may be pregnant, or are  planning to become pregnant.  If you drink alcohol: ? Limit how much you have to 0-1 drink a day. ? Be aware of how much alcohol is in your drink. In the [place] one drink equals one 12 oz bottle of beer (355 mL), one 5 oz glass of wine (148 mL), or one 1 oz glass of hard liquor (44 mL). Lifestyle  Take daily care of your teeth and gums.  Stay active. Exercise for at least 30 minutes on 5 or more days each week.  Do not use any products that contain nicotine or tobacco, such as cigarettes, e-cigarettes, and chewing tobacco. If you need help quitting, ask your health care provider.  If you are sexually active, practice safe sex. Use a condom or other form of birth control (contraception) in order to prevent pregnancy and STIs (sexually transmitted infections).  If told by your health care provider, take low-dose aspirin daily starting at age 57. What's next?  Visit your health care provider once a year for a well check visit.  Ask your  health care provider how often you should have your eyes and teeth checked.  Stay up to date on all vaccines. This information is not intended to replace advice given to you by your health care provider. Make sure you discuss any questions you have with your health care provider. Document Revised: 02/23/2018 Document Reviewed: 02/23/2018 Elsevier Patient Education  2020 [employer].

## 2019-07-06 ENCOUNTER — Ambulatory Visit (INDEPENDENT_AMBULATORY_CARE_PROVIDER_SITE_OTHER): Payer: Medicare Other | Admitting: Family Medicine

## 2019-07-06 ENCOUNTER — Encounter: Payer: Self-pay | Admitting: Family Medicine

## 2019-07-06 ENCOUNTER — Other Ambulatory Visit: Payer: Self-pay

## 2019-07-06 VITALS — BP 122/80 | HR 89 | Temp 97.2°F | Ht 69.0 in | Wt 145.2 lb

## 2019-07-06 DIAGNOSIS — R110 Nausea: Secondary | ICD-10-CM

## 2019-07-06 DIAGNOSIS — I959 Hypotension, unspecified: Secondary | ICD-10-CM

## 2019-07-06 DIAGNOSIS — R55 Syncope and collapse: Secondary | ICD-10-CM

## 2019-07-06 DIAGNOSIS — R635 Abnormal weight gain: Secondary | ICD-10-CM | POA: Diagnosis not present

## 2019-07-06 LAB — TSH: TSH: 2.57 u[IU]/mL (ref 0.35–4.50)

## 2019-07-06 MED ORDER — PANTOPRAZOLE SODIUM 40 MG PO TBEC: 40.0000 mg | DELAYED_RELEASE_TABLET | Freq: Every day | ORAL | 3 refills | Status: DC

## 2019-07-06 NOTE — Progress Notes (Signed)
Chief Complaint  Patient presents with  . Dizziness    jaw pain    Subjective: Patient is a 59 y.o. female here for syncope.  Pt passed out 2 d ago. This has happened in past. Saw cardiology in [place] and workup neg. Has happened 3 times since Thxgiving. No CP or SOB. Feels a strange dizziness. Unsure if it is positional. A friend is a Engineer, civil (consulting) and checked her BP which was 80's/60's while she was supine, sitting and standing. It did not drop when she changed positions. No inj.   Having some nausea. She has hx of GERD, failed Prevacid. Unsure if it is affecting her eating/drinking.   ROS: Heart: Denies chest pain  Lungs: Denies SOB   Past Medical History:  Diagnosis Date  . Chicken pox   . Frequent headaches   . History of fainting spells of unknown cause   . Migraines   . Urinary tract infection     Objective: BP 122/80 (BP Location: Left Arm, Patient Position: Sitting, Cuff Size: Normal)   Pulse 89   Temp (!) 97.2 F (36.2 C) (Temporal)   Ht 5\' 9"  (1.753 m)   Wt 145 lb 4 oz (65.9 kg)   LMP  (LMP Unknown)   SpO2 96%   BMI 21.45 kg/m  General: Awake, appears stated age HEENT: MMM, EOMi Heart: RRR, no murmurs, no bruits, no LE edema Lungs: CTAB, no rales, wheezes or rhonchi. No accessory muscle use Psych: Age appropriate judgment and insight, normal affect and mood  Assessment and Plan: Syncope, unspecified syncope type - Plan: EKG 12-Lead, Ambulatory referral to Cardiology  Nausea - Plan: pantoprazole (PROTONIX) 40 MG tablet  Hypotension, unspecified hypotension type  Weight gain - Plan: TSH  EKG is nsr, nml axis, no interval abn, no signs of ischemia, good R wave progression.  Stay hydrated. Ck labs. Consider salt tabs.  Get set up w cardiology team.  Start PPI. Reflux precautions verbalized and written down. Yearly scheduled at end of mo, f/u then.  The patient voiced understanding and agreement to the plan.  [place], DO 07/06/19  12:04  PM

## 2019-07-06 NOTE — Patient Instructions (Signed)
Give [place] 2-3 business days to get the results of your labs back.   If you do not hear anything about your referral in the next 1-2 weeks, call our office and ask for an update.  Stay hydrated. Consider salt tabs.  The only lifestyle changes that have data behind them are weight loss for the overweight/obese and elevating the head of the bed. Finding out which foods/positions are triggers is important.  Let us know if you need anything.

## 2019-07-08 ENCOUNTER — Encounter: Payer: Self-pay | Admitting: Family Medicine

## 2019-07-10 ENCOUNTER — Telehealth: Payer: Self-pay | Admitting: Family Medicine

## 2019-07-10 ENCOUNTER — Other Ambulatory Visit: Payer: Self-pay | Admitting: Family Medicine

## 2019-07-10 DIAGNOSIS — R635 Abnormal weight gain: Secondary | ICD-10-CM

## 2019-07-10 NOTE — Progress Notes (Signed)
T

## 2019-07-10 NOTE — Telephone Encounter (Signed)
Copied from CRM [id number]. Topic: General - Inquiry >> Jul 10, 2019  9:59 AM Reggie Pile, NT wrote: Reason for CRM: Pt called in stating she would like to have T3, T4, and TPO labs added if possible. Please advise.

## 2019-07-10 NOTE — Telephone Encounter (Signed)
Patient made aware of PCP instructions.  She verbalized understanding/ Scheduled lab appt // put in the order.

## 2019-07-10 NOTE — Telephone Encounter (Signed)
I can order let me know and will call her to schedule lab appt

## 2019-07-10 NOTE — Telephone Encounter (Signed)
Yes plz, let pt know there may be out of pocket cost with the T3 and TPO as it won't change the plan, whereas the T4 could. Ty.
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
## 2019-07-13 ENCOUNTER — Other Ambulatory Visit: Payer: Self-pay

## 2019-07-13 ENCOUNTER — Ambulatory Visit (INDEPENDENT_AMBULATORY_CARE_PROVIDER_SITE_OTHER): Payer: Medicare Other | Admitting: Cardiology

## 2019-07-13 ENCOUNTER — Encounter: Payer: Self-pay | Admitting: Cardiology

## 2019-07-13 ENCOUNTER — Other Ambulatory Visit (INDEPENDENT_AMBULATORY_CARE_PROVIDER_SITE_OTHER): Payer: Medicare Other

## 2019-07-13 VITALS — BP 120/82 | HR 95 | Ht 69.0 in | Wt 148.0 lb

## 2019-07-13 DIAGNOSIS — R55 Syncope and collapse: Secondary | ICD-10-CM | POA: Diagnosis not present

## 2019-07-13 DIAGNOSIS — R8290 Unspecified abnormal findings in urine: Secondary | ICD-10-CM | POA: Diagnosis not present

## 2019-07-13 DIAGNOSIS — R635 Abnormal weight gain: Secondary | ICD-10-CM | POA: Diagnosis not present

## 2019-07-13 DIAGNOSIS — Z6821 Body mass index (BMI) 21.0-21.9, adult: Secondary | ICD-10-CM | POA: Diagnosis not present

## 2019-07-13 DIAGNOSIS — N952 Postmenopausal atrophic vaginitis: Secondary | ICD-10-CM | POA: Diagnosis not present

## 2019-07-13 DIAGNOSIS — Z01419 Encounter for gynecological examination (general) (routine) without abnormal findings: Secondary | ICD-10-CM | POA: Diagnosis not present

## 2019-07-13 DIAGNOSIS — I951 Orthostatic hypotension: Secondary | ICD-10-CM

## 2019-07-13 DIAGNOSIS — E2740 Unspecified adrenocortical insufficiency: Secondary | ICD-10-CM | POA: Diagnosis not present

## 2019-07-13 DIAGNOSIS — Z124 Encounter for screening for malignant neoplasm of cervix: Secondary | ICD-10-CM | POA: Diagnosis not present

## 2019-07-13 MED ORDER — MIDODRINE HCL 5 MG PO TABS: 5.0000 mg | ORAL_TABLET | Freq: Two times a day (BID) | ORAL | 3 refills | Status: DC

## 2019-07-13 MED ORDER — FLUDROCORTISONE ACETATE 0.1 MG PO TABS: 0.1000 mg | ORAL_TABLET | Freq: Every day | ORAL | 3 refills | Status: DC

## 2019-07-13 NOTE — Patient Instructions (Signed)
Medication Instructions:  Start Florinef 0.1mg  daily Start Midodrine 5mg  twice daily Check Blood pressure laying, sitting and standing.  Standing BP >100, Siting <150  *If you need a refill on your cardiac medications before your next appointment, please call your pharmacy*  Lab Work: CBC If you have labs (blood work) drawn today and your tests are completely normal, you will receive your results only by: MyChart Message (if you have MyChart) OR . A paper copy in the mail If you have any lab test that is abnormal or we need to change your treatment, we will call you to review the results.  Testing/Procedures: Echo Zio  Follow-Up: At [facility], you and your health needs are our priority.  As part of our continuing mission to provide you with exceptional heart care, we have created designated Provider Care Teams.  These Care Teams include your primary Cardiologist (physician) and Advanced Practice Providers (APPs -  Physician Assistants and Nurse Practitioners) who all work together to provide you with the care you need, when you need it.  Your next appointment:   6 week(s)  The format for your next appointment:   Either In Person or Virtual  Provider:   [facility], MD  Other Instructions A zio monitor was ordered today. It will remain on for 14 days. You will then return monitor and event diary in provided box. It takes 1-2 weeks for report to be downloaded and returned to Norman Herrlich. We will call you with the results. If monitor falls off or has orange flashing light, please call Zio for further instructions.   Your physician has requested that you have an echocardiogram. Echocardiography is a painless test that uses sound waves to create images of your heart. It provides your doctor with information about the size and shape of your heart and how well your heart's chambers and valves are working. This procedure takes approximately one hour. There are no restrictions for this  procedure.

## 2019-07-14 LAB — CBC
Hematocrit: 42.8 % (ref 34.0–46.6)
Hemoglobin: 14.1 g/dL (ref 11.1–15.9)
MCH: 28.7 pg (ref 26.6–33.0)
MCHC: 32.9 g/dL (ref 31.5–35.7)
MCV: 87 fL (ref 79–97)
Platelets: 302 10*3/uL (ref 150–450)
RBC: 4.91 x10E6/uL (ref 3.77–5.28)
RDW: 13.1 % (ref 11.7–15.4)
WBC: 9.8 10*3/uL (ref 3.4–10.8)

## 2019-07-16 ENCOUNTER — Telehealth: Payer: Self-pay | Admitting: Emergency Medicine

## 2019-07-16 LAB — T3: T3, Total: 131 ng/dL (ref 76–181)

## 2019-07-16 LAB — THYROID PEROXIDASE ANTIBODIES (TPO) (REFL): Thyroperoxidase Ab SerPl-aCnc: <1 [IU]/mL

## 2019-07-16 LAB — T4: T4, Total: 7.9 ug/dL (ref 5.1–11.9)

## 2019-07-16 NOTE — Telephone Encounter (Signed)
Left results on patients voicemail per dpr, advised her to call with any questions.

## 2019-07-19 ENCOUNTER — Ambulatory Visit (INDEPENDENT_AMBULATORY_CARE_PROVIDER_SITE_OTHER): Payer: Medicare Other

## 2019-07-19 DIAGNOSIS — R55 Syncope and collapse: Secondary | ICD-10-CM | POA: Diagnosis not present

## 2019-07-27 ENCOUNTER — Encounter: Payer: Medicare Other | Admitting: Family Medicine

## 2019-08-06 ENCOUNTER — Ambulatory Visit (HOSPITAL_BASED_OUTPATIENT_CLINIC_OR_DEPARTMENT_OTHER)
Admission: RE | Admit: 2019-08-06 | Discharge: 2019-08-06 | Disposition: A | Discharge status: Home / Self Care | Payer: Medicare Other | Source: Ambulatory Visit | Attending: Cardiology | Admitting: Cardiology

## 2019-08-06 ENCOUNTER — Other Ambulatory Visit: Payer: Self-pay

## 2019-08-06 DIAGNOSIS — I951 Orthostatic hypotension: Secondary | ICD-10-CM | POA: Diagnosis not present

## 2019-08-06 DIAGNOSIS — E2740 Unspecified adrenocortical insufficiency: Secondary | ICD-10-CM | POA: Insufficient documentation

## 2019-08-06 DIAGNOSIS — R55 Syncope and collapse: Secondary | ICD-10-CM | POA: Diagnosis not present

## 2019-08-06 NOTE — Progress Notes (Signed)
  Echocardiogram 2D Echocardiogram has been performed.  Brenda Curry 08/06/2019, 9:49 AM

## 2019-08-07 ENCOUNTER — Telehealth: Payer: Self-pay | Admitting: Cardiology

## 2019-08-07 NOTE — Telephone Encounter (Signed)
Called patient informed her per Dr. Dulce Sellar of echocardiogram results. No further questions.

## 2019-08-07 NOTE — Telephone Encounter (Signed)
Patient is returning phone call in regards to Echo results.

## 2019-08-09 ENCOUNTER — Other Ambulatory Visit: Payer: Self-pay

## 2019-08-09 ENCOUNTER — Ambulatory Visit
Admission: RE | Admit: 2019-08-09 | Discharge: 2019-08-09 | Disposition: A | Discharge status: Home / Self Care | Payer: Medicare Other | Source: Ambulatory Visit | Attending: Family Medicine | Admitting: Family Medicine

## 2019-08-09 ENCOUNTER — Other Ambulatory Visit: Payer: Self-pay | Admitting: Family Medicine

## 2019-08-09 DIAGNOSIS — Z1231 Encounter for screening mammogram for malignant neoplasm of breast: Secondary | ICD-10-CM | POA: Diagnosis not present

## 2019-08-10 DIAGNOSIS — R55 Syncope and collapse: Secondary | ICD-10-CM | POA: Diagnosis not present

## 2019-08-13 ENCOUNTER — Other Ambulatory Visit: Payer: Self-pay | Admitting: Cardiology

## 2019-08-13 DIAGNOSIS — R55 Syncope and collapse: Secondary | ICD-10-CM

## 2019-08-14 ENCOUNTER — Other Ambulatory Visit: Payer: Self-pay

## 2019-08-15 ENCOUNTER — Ambulatory Visit (INDEPENDENT_AMBULATORY_CARE_PROVIDER_SITE_OTHER): Payer: Medicare Other | Admitting: Family Medicine

## 2019-08-15 ENCOUNTER — Encounter: Payer: Self-pay | Admitting: Family Medicine

## 2019-08-15 VITALS — BP 120/82 | HR 76 | Temp 96.0°F | Ht 69.0 in | Wt 148.2 lb

## 2019-08-15 DIAGNOSIS — E271 Primary adrenocortical insufficiency: Secondary | ICD-10-CM

## 2019-08-15 DIAGNOSIS — Z23 Encounter for immunization: Secondary | ICD-10-CM | POA: Diagnosis not present

## 2019-08-15 DIAGNOSIS — R0600 Dyspnea, unspecified: Secondary | ICD-10-CM

## 2019-08-15 DIAGNOSIS — R55 Syncope and collapse: Secondary | ICD-10-CM | POA: Diagnosis not present

## 2019-08-15 DIAGNOSIS — Z13220 Encounter for screening for lipoid disorders: Secondary | ICD-10-CM

## 2019-08-15 HISTORY — DX: Primary adrenocortical insufficiency: E27.1

## 2019-08-15 LAB — COMPREHENSIVE METABOLIC PANEL
ALT: 13 U/L (ref 0–35)
AST: 15 U/L (ref 0–37)
Albumin: 4.1 g/dL (ref 3.5–5.2)
Alkaline Phosphatase: 61 U/L (ref 39–117)
BUN: 15 mg/dL (ref 6–23)
CO2: 29 mEq/L (ref 19–32)
Calcium: 9.3 mg/dL (ref 8.4–10.5)
Chloride: 103 mEq/L (ref 96–112)
Creatinine, Ser: 0.77 mg/dL (ref 0.40–1.20)
GFR: 76.97 mL/min (ref >60.00)
Glucose, Bld: 70 mg/dL (ref 70–99)
Potassium: 4.2 mEq/L (ref 3.5–5.1)
Sodium: 140 mEq/L (ref 135–145)
Total Bilirubin: 0.4 mg/dL (ref 0.2–1.2)
Total Protein: 6.2 g/dL (ref 6.0–8.3)

## 2019-08-15 LAB — LIPID PANEL
Cholesterol: 205 mg/dL — ABNORMAL HIGH (ref 0–200)
HDL: 70.8 mg/dL (ref >39.00)
LDL Cholesterol: 119 mg/dL — ABNORMAL HIGH (ref 0–99)
NonHDL: 134.14
Total CHOL/HDL Ratio: 3
Triglycerides: 74 mg/dL (ref 0.0–149.0)
VLDL: 14.8 mg/dL (ref 0.0–40.0)

## 2019-08-15 LAB — CBC
HCT: 42.7 % (ref 36.0–46.0)
Hemoglobin: 13.9 g/dL (ref 12.0–15.0)
MCHC: 32.5 g/dL (ref 30.0–36.0)
MCV: 87.9 fl (ref 78.0–100.0)
Platelets: 251 10*3/uL (ref 150.0–400.0)
RBC: 4.86 Mil/uL (ref 3.87–5.11)
RDW: 13.4 % (ref 11.5–15.5)
WBC: 7 10*3/uL (ref 4.0–10.5)

## 2019-08-15 NOTE — Progress Notes (Signed)
CC: F/u  Subjective: Patient is a 59 y.o. female here for f/u.  Patient was having issues with syncope.  She saw cardiology and is awaiting the results from her rhythm monitor.  She was also restarted on her Cortef for adrenal insufficiency.  It is recommended she follow-up with an endocrinologist.  She is requesting a referral today.  Patient has been having shortness of breath at random times.  No wheezing or cough.  She has no history of asthma, smoking, or smoke exposure.  She wonders if it is related to her adrenal insufficiency.  ROS: Heart: Denies chest pain  Lungs: Denies current SOB   Past Medical History:  Diagnosis Date  . Chicken pox   . Frequent headaches   . History of fainting spells of unknown cause   . Migraines   . Urinary tract infection     Objective: BP 120/82 (BP Location: Left Arm, Patient Position: Sitting, Cuff Size: Normal)   Pulse 76   Temp (!) 96 F (35.6 C) (Temporal)   Ht 5\' 9"  (1.753 m)   Wt 148 lb 4 oz (67.2 kg)   LMP  (LMP Unknown)   SpO2 98%   BMI 21.89 kg/m  General: Awake, appears stated age HEENT: MMM, EOMi Heart: RRR, no bruits, no lower extremity edema Lungs: CTAB, no rales, wheezes or rhonchi. No accessory muscle use Neuro: DTRs equal and symmetric, no clonus, no cerebellar signs MSK: No deformity or asymmetry Abdomen: Bowel sounds present, soft, nontender, nondistended, no masses or organomegaly Psych: Age appropriate judgment and insight, normal affect and mood  Assessment and Plan: Addison disease (HCC) - Plan: Ambulatory referral to Endocrinology  Syncope, unspecified syncope type - Plan: Ambulatory referral to Endocrinology, CBC, Comprehensive metabolic panel  Screening cholesterol level - Plan: Lipid panel  Dyspnea, unspecified type  Need for influenza vaccination - Plan: Flu Vaccine QUAD 6+ mos PF IM (Fluarix Quad PF)  Refer to endocrinology.  Check some labs.  Offered an inhaler but she would like to see what her  event monitor shows and the results from her endocrinology appointment. I will see her in 1 year unless she needs me. The patient voiced understanding and agreement to the plan.  [place], DO 08/15/19  12:15 PM

## 2019-08-15 NOTE — Patient Instructions (Signed)
Give us 2-3 business days to get the results of your labs back.   Keep the diet clean and stay active.  If you do not hear anything about your referral in the next 1-2 weeks, call our office and ask for an update.  Let us know if you need anything. 

## 2019-08-21 ENCOUNTER — Other Ambulatory Visit: Payer: Self-pay | Admitting: Family Medicine

## 2019-08-21 DIAGNOSIS — R928 Other abnormal and inconclusive findings on diagnostic imaging of breast: Secondary | ICD-10-CM

## 2019-08-31 ENCOUNTER — Ambulatory Visit
Admission: RE | Admit: 2019-08-31 | Discharge: 2019-08-31 | Disposition: A | Discharge status: Home / Self Care | Payer: Medicare Other | Source: Ambulatory Visit | Attending: Family Medicine | Admitting: Family Medicine

## 2019-08-31 ENCOUNTER — Other Ambulatory Visit: Payer: Self-pay

## 2019-08-31 ENCOUNTER — Other Ambulatory Visit: Payer: Self-pay | Admitting: Family Medicine

## 2019-08-31 DIAGNOSIS — R921 Mammographic calcification found on diagnostic imaging of breast: Secondary | ICD-10-CM | POA: Diagnosis not present

## 2019-08-31 DIAGNOSIS — R928 Other abnormal and inconclusive findings on diagnostic imaging of breast: Secondary | ICD-10-CM

## 2019-09-07 ENCOUNTER — Other Ambulatory Visit: Payer: Self-pay

## 2019-09-07 ENCOUNTER — Encounter: Payer: Self-pay | Admitting: Cardiology

## 2019-09-07 ENCOUNTER — Ambulatory Visit (INDEPENDENT_AMBULATORY_CARE_PROVIDER_SITE_OTHER): Payer: Medicare Other | Admitting: Cardiology

## 2019-09-07 VITALS — BP 126/88 | HR 81 | Temp 98.4°F | Ht 69.0 in | Wt 148.0 lb

## 2019-09-07 DIAGNOSIS — I951 Orthostatic hypotension: Secondary | ICD-10-CM | POA: Diagnosis not present

## 2019-09-07 DIAGNOSIS — R55 Syncope and collapse: Secondary | ICD-10-CM

## 2019-09-07 DIAGNOSIS — E2740 Unspecified adrenocortical insufficiency: Secondary | ICD-10-CM | POA: Diagnosis not present

## 2019-09-07 MED ORDER — MIDODRINE HCL 5 MG PO TABS: 5.0000 mg | ORAL_TABLET | Freq: Two times a day (BID) | ORAL | 2 refills | Status: DC

## 2019-09-07 NOTE — Patient Instructions (Signed)
Medication Instructions:  °Your physician recommends that you continue on your current medications as directed. Please refer to the Current Medication list given to you today. ° °*If you need a refill on your cardiac medications before your next appointment, please call your pharmacy* ° ° °Lab Work: °None ordered  °If you have labs (blood work) drawn today and your tests are completely normal, you will receive your results only by: °• MyChart Message (if you have MyChart) OR °• A paper copy in the mail °If you have any lab test that is abnormal or we need to change your treatment, we will call you to review the results. ° ° °Testing/Procedures: °None ordered  ° ° °Follow-Up: °At [facility], you and your health needs are our priority.  As part of our continuing mission to provide you with exceptional heart care, we have created designated Provider Care Teams.  These Care Teams include your primary Cardiologist (physician) and Advanced Practice Providers (APPs -  Physician Assistants and Nurse Practitioners) who all work together to provide you with the care you need, when you need it. ° °We recommend signing up for the patient portal called "MyChart".  Sign up information is provided on this After Visit Summary.  MyChart is used to connect with patients for Virtual Visits (Telemedicine).  Patients are able to view lab/test results, encounter notes, upcoming appointments, etc.  Non-urgent messages can be sent to your provider as well.   °To learn more about what you can do with MyChart, go to [email or web address].   ° °Your next appointment:   °As needed °
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
## 2019-09-12 DIAGNOSIS — Z20828 Contact with and (suspected) exposure to other viral communicable diseases: Secondary | ICD-10-CM | POA: Diagnosis not present

## 2019-10-03 ENCOUNTER — Encounter: Payer: Self-pay | Admitting: Internal Medicine

## 2019-10-03 ENCOUNTER — Other Ambulatory Visit: Payer: Self-pay

## 2019-10-03 ENCOUNTER — Ambulatory Visit (INDEPENDENT_AMBULATORY_CARE_PROVIDER_SITE_OTHER): Payer: Medicare Other | Admitting: Internal Medicine

## 2019-10-03 VITALS — BP 136/82 | HR 76 | Temp 98.5°F | Ht 69.0 in | Wt 145.2 lb

## 2019-10-03 DIAGNOSIS — Z8639 Personal history of other endocrine, nutritional and metabolic disease: Secondary | ICD-10-CM

## 2019-10-03 LAB — CORTISOL
Cortisol, Plasma: 17.5 ug/dL
Cortisol, Plasma: 4.4 ug/dL
Cortisol, Plasma: 25.4 ug/dL

## 2019-10-03 MED ORDER — COSYNTROPIN 0.25 MG IJ SOLR
0.2500 mg | Freq: Once | INTRAMUSCULAR | Status: AC
  Administered 2019-10-03: 0.25 mg via INTRAMUSCULAR

## 2019-10-03 NOTE — Progress Notes (Signed)
Name: Brenda Curry  MRN/ DOB: [id number], Jan 19, 1961    Age/ Sex: 59 y.o., female    PCP: Brenda Pal, DO   Reason for Endocrinology Evaluation: Secondary adrenal Insufficieny     Date of Initial Endocrinology Evaluation: 10/03/2019     HPI: [name] is a 59 y.o. female with a past medical history of chronic pain syndrome. The patient presented for initial endocrinology clinic visit on 10/03/2019 for consultative assistance with her secondary adrenal insufficiency   She has a history of adrenal insufficiency secondary to steroid injections, she was on hydrocortisone 10 mg in the past but has been off since 2014, she continues with palpitations, fatigue and dizziness.   Symptoms worsened around the latter part of 2020.  Her nurse friend checked BP and was 80's/60's. She was evaluated by cardiology 07/13/2019 and was started on Florinef and Midodrine.   Since then has noted improvement in palpitations. But continues with fatigue and near syncope.   Weight has been increasing   Sister with thyroid disease    HISTORY:  Past Medical History:  Past Medical History:  Diagnosis Date  . Chicken pox   . Frequent headaches   . History of fainting spells of unknown cause   . Migraines   . Urinary tract infection    Past Surgical History:  Past Surgical History:  Procedure Laterality Date  . BACK SURGERY  2007  . BACK SURGERY  2009  . NECK SURGERY  2007      Social History:  reports that she quit smoking about a year ago. Her smoking use included cigarettes. She quit after 36.00 years of use. She has never used smokeless tobacco. She reports current alcohol use. She reports that she does not use drugs.  Family History: family history includes Alcohol abuse in her paternal grandfather; Aortic aneurysm in her father and mother; Atrial fibrillation in her brother; Biliary Cirrhosis in her mother; Cancer in her father and paternal grandfather;  Diabetes in her brother and maternal grandmother; Hashimoto's thyroiditis in her sister; Hearing loss in her mother; Heart attack in her father; Heart disease in her brother, father, and paternal grandfather; High Cholesterol in her father; Hyperlipidemia in her brother and mother; Hypertension in her father; Obesity in her brother; Thyroid disease in her cousin.   HOME MEDICATIONS: Allergies as of 10/03/2019      Reactions   Macrobid [nitrofurantoin Macrocrystal] Hives   Tape Other (See Comments)   blisters      Medication List       Accurate as of October 03, 2019  1:28 PM. If you have any questions, ask your nurse or doctor.        fludrocortisone 0.1 MG tablet Commonly known as: FLORINEF Take 1 tablet (0.1 mg total) by mouth daily.   ibuprofen 200 MG tablet Commonly known as: ADVIL Take 200 mg by mouth every 6 (six) hours as needed.   midodrine 5 MG tablet Commonly known as: PROAMATINE Take 1 tablet (5 mg total) by mouth 2 (two) times daily.   pantoprazole 40 MG tablet Commonly known as: PROTONIX Take 1 tablet (40 mg total) by mouth daily.         REVIEW OF SYSTEMS: A comprehensive ROS was conducted with the patient and is negative except as per HPI and below:  ROS     OBJECTIVE:  VS: BP 136/82 (BP Location: Left Arm, Patient Position: Sitting, Cuff Size: Normal)   Pulse 76  Temp 98.5 F (36.9 C)   Ht 5\' 9"  (1.753 m)   Wt 145 lb 3.2 oz (65.9 kg)   LMP  (LMP Unknown)   SpO2 99%   BMI 21.44 kg/m    Wt Readings from Last 3 Encounters:  10/03/19 145 lb 3.2 oz (65.9 kg)  09/07/19 148 lb (67.1 kg)  08/15/19 148 lb 4 oz (67.2 kg)     EXAM: General: Pt appears well and is in NAD  Eyes: External eye exam normal without stare, lid lag or exophthalmos.  EOM intact.    Neck: General: Supple without adenopathy. Thyroid: Thyroid size normal.  No goiter or nodules appreciated. No thyroid bruit.  Lungs: Clear with good BS bilat with no rales, rhonchi, or wheezes   Heart: Auscultation: RRR.  Abdomen: Normoactive bowel sounds, soft, nontender, without masses or organomegaly palpable  Extremities:  BL LE: No pretibial edema normal ROM and strength.  Skin: Hair: Texture and amount normal with gender appropriate distribution Skin Inspection: No rashes Skin Palpation: Skin temperature, texture, and thickness normal to palpation  Neuro: Cranial nerves: II - XII grossly intact  Motor: Normal strength throughout DTRs: 2+ and symmetric in UE without delay in relaxation phase  Mental Status: Judgment, insight: Intact Orientation: Oriented to time, place, and person Mood and affect: No depression, anxiety, or agitation     DATA REVIEWED: Results for Brenda, Curry "Brenda Curry" (MRN Brenda Curry) as of 10/04/2019 12:17  Ref. Range 10/03/2019 13:47 10/03/2019 14:21 10/03/2019 14:52  Cortisol, Plasma Latest Units: ug/dL 4.4 12/03/2019 81.2      ASSESSMENT/PLAN/RECOMMENDATIONS:   1. History of Secondary Adrenal Insufficiency:   -Patient does endorse a history of secondary adrenal insufficiency due to steroid injections in the past.  She was on hydrocortisone treatment until approximately 2014.  She has not had any steroid injections in the recent years, hence her adrenal gland has been able to recover properly. -The patient does NOT have Addison's disease, as Addison's disease is a primary adrenal insufficiency due to immune mediated destruction of the gland, and as mentioned above she had SECONDAY adrenal insufficiency due to chronic steroid use in the past, this is why she has been able to survive without any hydrocortisone for the past 6 years. -She does endorse chronic symptoms of fatigue, hypotension, and near syncope -She was recently evaluated by cardiology and was started on fludrocortisone as well as midodrine -Patient does have nonspecific symptoms, but cosyntropin stimulation test today came back normal. -We did discuss the differential diagnosis of  hypotension to include cardiac reasons versus neuropathic reasons. -Patient will not be started on any hydrocortisone replacement therapy at this time   Follow-up as needed  Signed electronically by: 2015, MD  [facility] Endocrinology  [facility] [address] [place] [place], [place] Phone: [phone] FAX: [phone]   CC: [phone], DO [address] [place] Phone: [phone] Fax: [phone]   Return to Endocrinology clinic as below: Future Appointments  Date Time Provider Department Center  03/04/2020  3:40 PM [facility]  07/07/2020 11:00 AM 09/04/2020, RN [facility]  08/15/2020 10:00 AM 08/17/2020, Carmelia Roller, DO [facility]

## 2019-10-04 ENCOUNTER — Encounter: Payer: Self-pay | Admitting: Internal Medicine

## 2019-10-05 ENCOUNTER — Telehealth: Payer: Self-pay | Admitting: Internal Medicine

## 2019-10-05 DIAGNOSIS — R5383 Other fatigue: Secondary | ICD-10-CM

## 2019-10-05 DIAGNOSIS — R7989 Other specified abnormal findings of blood chemistry: Secondary | ICD-10-CM

## 2019-10-05 LAB — ACTH: C206 ACTH: <5 pg/mL — ABNORMAL LOW (ref 6–50)

## 2019-10-05 NOTE — Telephone Encounter (Signed)
Attempted to contact the patient on 10/05/2019 at 1450, there was no answer I have left a message to check the portal messages.   Despite having a normal cosyntropin stimulation test the patient did have an detectable ACTH.  -Typically people who have have an undetectable ACTH long-term the cosyntropin test would be abnormal, as long-term deficiency of ACTH eventually will cause adrenal atrophy.  In this case her cosyntropin test was normal, with cortisol level over 18 at 60-minute mark. -Since her history, labs, and clinical presentation are discordant I would like to proceed with further testing at this time.   -I would like to repeat her ACTH as well as FSH, LH, estradiol level as well as TFTs.  -In the portal message she was advised to contact our office to schedule a fasting a.m. lab   We will continue to follow-up    Abby Raelyn Mora, MD  [facility] Endocrinology  [facility] [address] [place], [place] [address] Phone: [phone] FAX: [phone]

## 2019-10-12 ENCOUNTER — Other Ambulatory Visit: Payer: Self-pay

## 2019-10-12 ENCOUNTER — Other Ambulatory Visit (INDEPENDENT_AMBULATORY_CARE_PROVIDER_SITE_OTHER): Payer: Medicare Other

## 2019-10-12 ENCOUNTER — Other Ambulatory Visit: Payer: Self-pay | Admitting: Internal Medicine

## 2019-10-12 DIAGNOSIS — R5383 Other fatigue: Secondary | ICD-10-CM

## 2019-10-12 DIAGNOSIS — R7989 Other specified abnormal findings of blood chemistry: Secondary | ICD-10-CM

## 2019-10-15 ENCOUNTER — Other Ambulatory Visit: Payer: Self-pay

## 2019-10-15 ENCOUNTER — Other Ambulatory Visit (INDEPENDENT_AMBULATORY_CARE_PROVIDER_SITE_OTHER): Payer: Medicare Other

## 2019-10-15 ENCOUNTER — Telehealth: Payer: Self-pay

## 2019-10-15 DIAGNOSIS — R7989 Other specified abnormal findings of blood chemistry: Secondary | ICD-10-CM

## 2019-10-15 DIAGNOSIS — R5383 Other fatigue: Secondary | ICD-10-CM

## 2019-10-15 LAB — T4, FREE: Free T4: 0.75 ng/dL (ref 0.60–1.60)

## 2019-10-15 LAB — CORTISOL: Cortisol, Plasma: 21.1 ug/dL

## 2019-10-15 LAB — TSH: TSH: 3.92 u[IU]/mL (ref 0.35–4.50)

## 2019-10-15 LAB — FOLLICLE STIMULATING HORMONE: FSH: 79.7 m[IU]/mL

## 2019-10-15 LAB — LUTEINIZING HORMONE: LH: 55.15 m[IU]/mL

## 2019-10-19 LAB — ACTH: C206 ACTH: 13 pg/mL (ref 6–50)

## 2019-10-19 LAB — PROLACTIN: Prolactin: 9.9 ng/mL

## 2019-10-19 LAB — INSULIN-LIKE GROWTH FACTOR
IGF-I, LC/MS: 70 ng/mL (ref 50–317)
Z-Score (Female): -1.3 SD (ref ?–2.0)

## 2019-10-19 LAB — ESTRADIOL: Estradiol: <15 pg/mL

## 2019-10-19 LAB — EXTRA SPECIMEN

## 2019-11-22 ENCOUNTER — Telehealth: Payer: Self-pay | Admitting: Cardiology

## 2019-11-22 NOTE — Telephone Encounter (Signed)
Left message on patients voicemail to please return our call.   

## 2019-11-22 NOTE — Telephone Encounter (Signed)
Spoke with the patient and let her know Dr. Hulen Shouts recommendation to take this medication as needed based on her BP. She verbalizes understanding and does not voice any other issues or concerns at this time.     Encouraged patient to call back with any questions or concerns.

## 2019-11-22 NOTE — Telephone Encounter (Signed)
Pt c/o BP issue: STAT if pt c/o blurred vision, one-sided weakness or slurred speech  1. What are your last 5 BP readings?   136/86 HR 112 at rest   2. Are you having any other symptoms (ex. Dizziness, headache, blurred vision, passed out)?  Headache (dull on the top of her head)  3. What is your BP issue? Pt says her BP is high and HR is high and thinks her medication may need adjusted. She was not sure if she would need to be seen by Dr. Dulce Sellar or not  Pt was in the car. She has a list of all of her BP readings but was not able to give them at the time of the call

## 2019-11-22 NOTE — Telephone Encounter (Signed)
Pt c/o BP issue: STAT if pt c/o blurred vision, one-sided weakness or slurred speech  1. What are your last 5 BP readings?  11/18/19 136/86  11/19/19 133/89 11/20/19 111/75 11/21/19 137/92 11/22/19 136/86  2. Are you having any other symptoms (ex. Dizziness, headache, blurred vision, passed out)? Headache   3. What is your BP issue? Brenda Curry is returning Brenda Curry's call with the rest of her BP readings. She states when [name] calls it is silenced due to her phone picking it up as spam. She has tried to change this in her settings, but they are still not coming through. She states she will try her best to look out for her callback, but if she is unable to answer she can leave a very detailed message so neither has to continue playing phone tag. Please advise.

## 2019-11-22 NOTE — Telephone Encounter (Signed)
I think that she is correct if her blood pressures are greater then 100-110 do not take her midodrine, so for this point she will take it twice daily as needed checking blood pressure in the morning and in the evening.

## 2020-03-24 ENCOUNTER — Other Ambulatory Visit: Payer: Self-pay

## 2020-03-24 ENCOUNTER — Other Ambulatory Visit: Payer: Self-pay | Admitting: Family Medicine

## 2020-03-24 ENCOUNTER — Ambulatory Visit
Admission: RE | Admit: 2020-03-24 | Discharge: 2020-03-24 | Disposition: A | Discharge status: Home / Self Care | Payer: Medicare Other | Source: Ambulatory Visit | Attending: Family Medicine | Admitting: Family Medicine

## 2020-03-24 DIAGNOSIS — R921 Mammographic calcification found on diagnostic imaging of breast: Secondary | ICD-10-CM

## 2020-05-08 DIAGNOSIS — Z20822 Contact with and (suspected) exposure to covid-19: Secondary | ICD-10-CM | POA: Diagnosis not present

## 2020-05-14 DIAGNOSIS — Z03818 Encounter for observation for suspected exposure to other biological agents ruled out: Secondary | ICD-10-CM | POA: Diagnosis not present

## 2020-05-14 DIAGNOSIS — Z20822 Contact with and (suspected) exposure to covid-19: Secondary | ICD-10-CM | POA: Diagnosis not present

## 2020-07-07 ENCOUNTER — Ambulatory Visit: Payer: Self-pay | Admitting: *Deleted

## 2020-07-17 ENCOUNTER — Ambulatory Visit: Payer: Self-pay

## 2020-08-01 IMAGING — MG DIGITAL DIAGNOSTIC UNILAT LEFT W/ CAD
9 of 11 series · 9 of 19 positions shown · non-contrast
Comparison: Previous exam(s).

CLINICAL DATA: 59-year-old female presenting as a recall from
screening for left breast calcifications.

EXAM:
DIGITAL DIAGNOSTIC LEFT MAMMOGRAM

[L CC (1 of 2)]
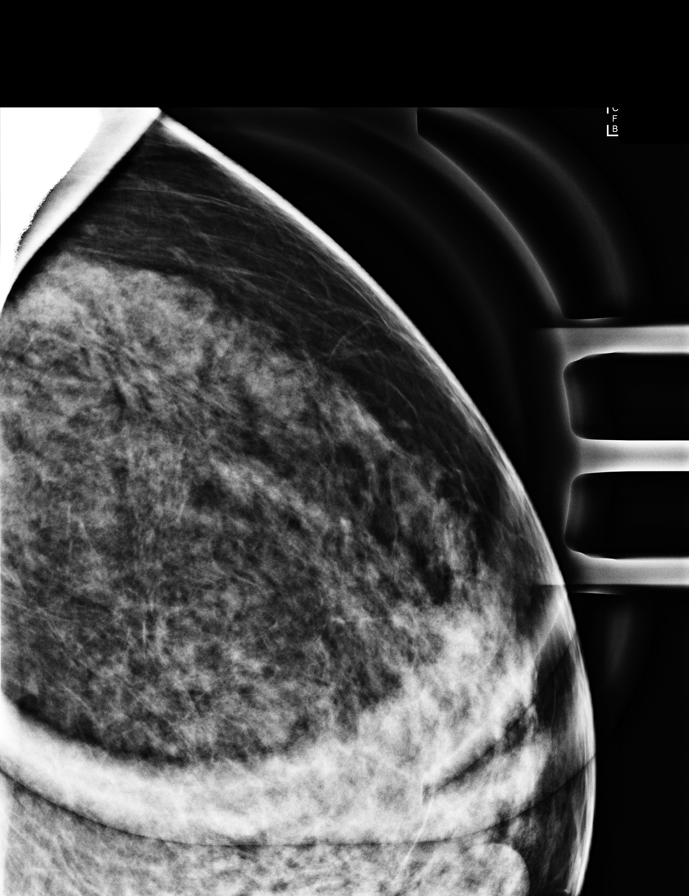

[L MLO (1 of 2)]
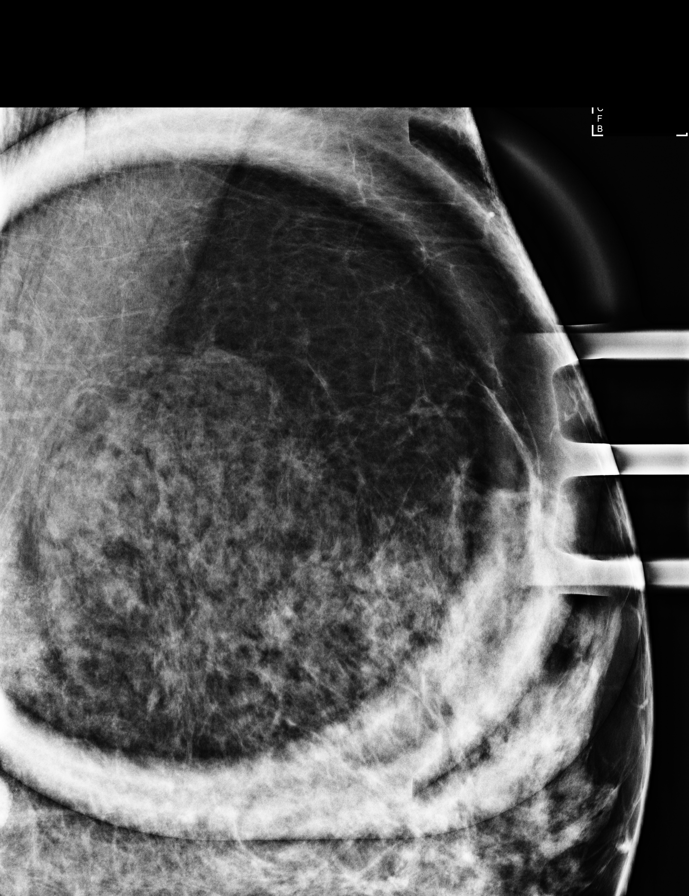

[L XCCL (1 of 2)]
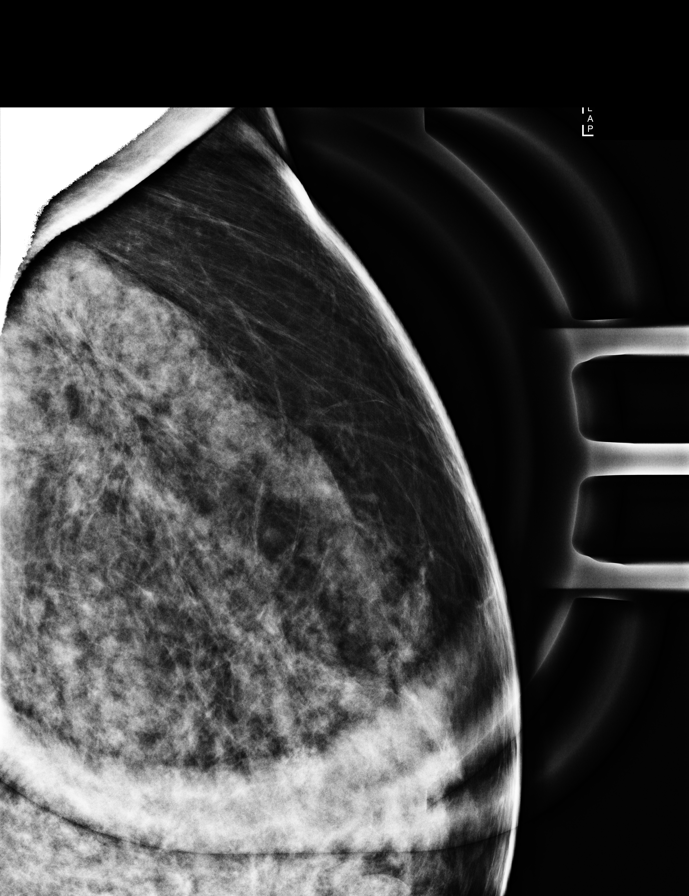

[L LM]
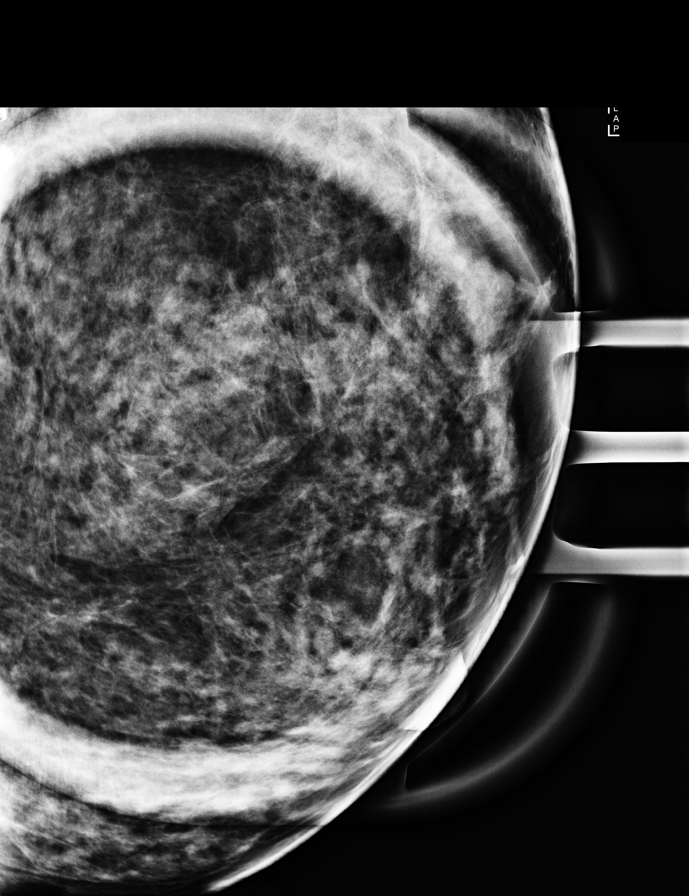

[L XCCL (2 of 2)]
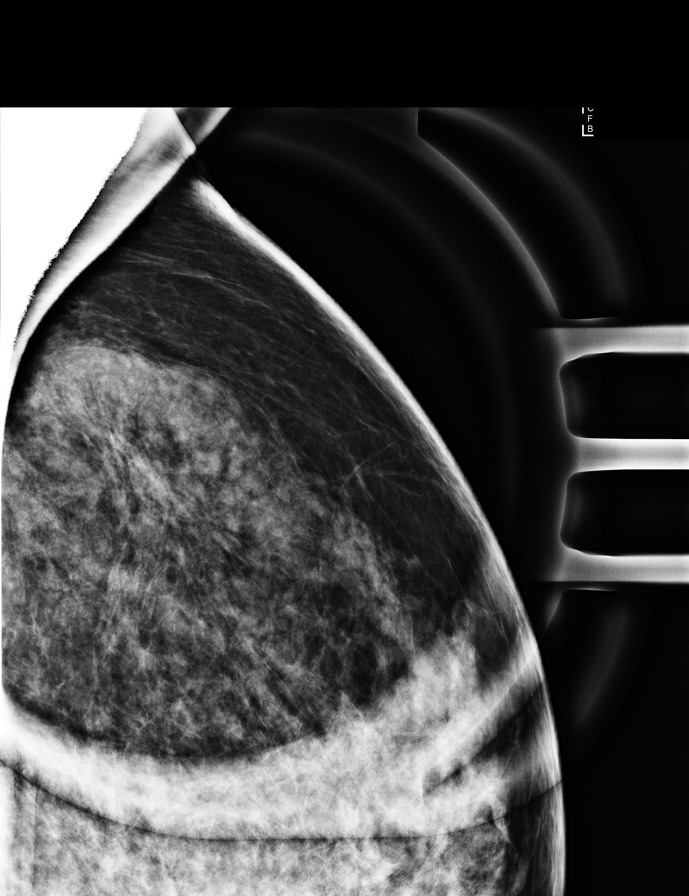

[L MLO (2 of 2)]
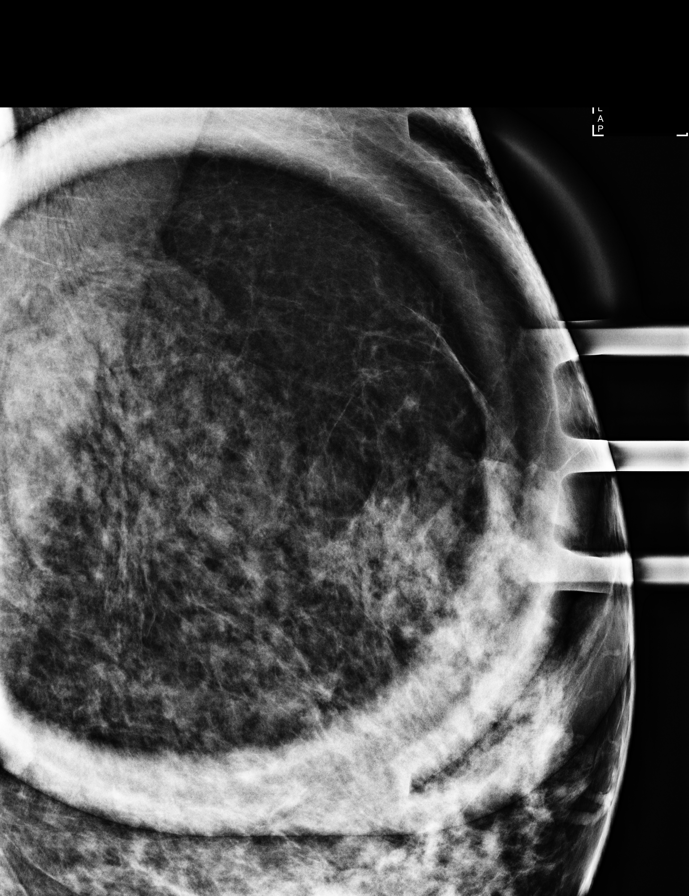

[L CC (2 of 2)]
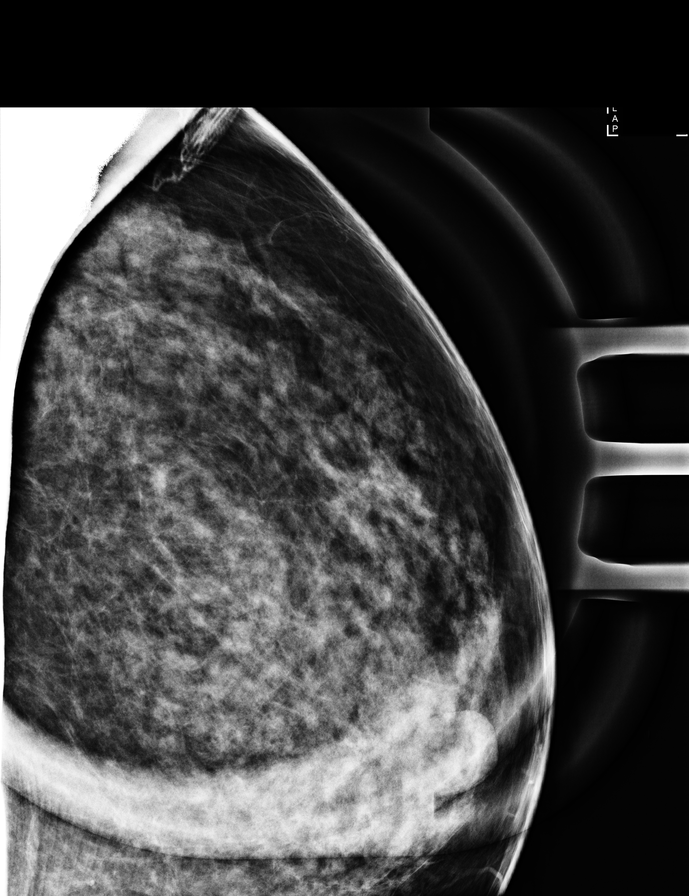

[L ML]
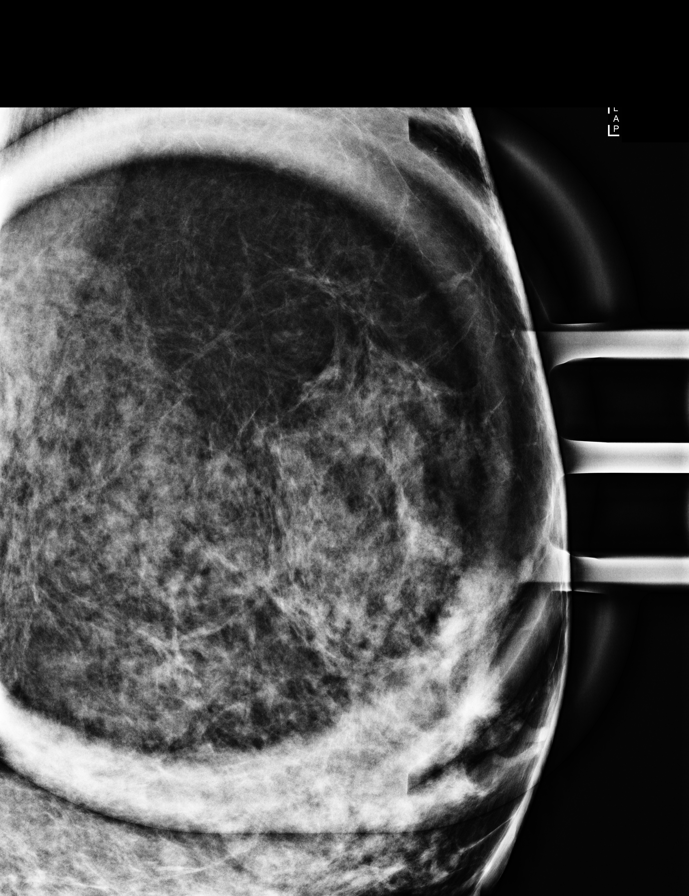

[L MLO synth-2D]
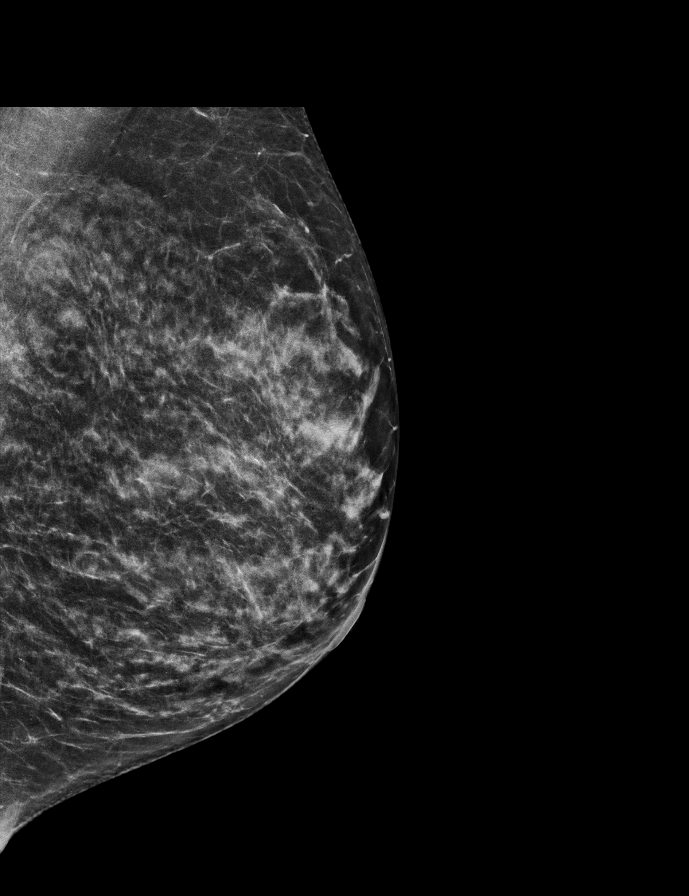

[9 of 19 positions shown; findings below may reference images not displayed]

ACR Breast Density Category c: The breast tissue is heterogeneously
dense, which may obscure small masses.
FINDINGS: Spot magnification views were performed for the questioned area of
calcifications in the far posterior outer breast. On the additional
imaging there is persistence of grouped punctate calcifications
which are somewhat obscured by the heterogeneity of the tissue. The
calcifications span approximately 1.5 cm. Comparison to prior
mammogram is difficult due to the far posterior location of the
calcifications, which were likely beyond the field of view on the
prior mammogram.
IMPRESSION: Grouped calcifications spanning approximately 1.5 cm in the far
posterior outer left breast are probably benign.

RECOMMENDATION:
Diagnostic left breast mammogram in 6 months.

I have discussed the findings and recommendations with the patient.
If applicable, a reminder letter will be sent to the patient
regarding the next appointment.

BI-RADS CATEGORY  3: Probably benign.

## 2020-08-05 ENCOUNTER — Telehealth: Payer: Self-pay

## 2020-08-05 NOTE — Telephone Encounter (Signed)
Clarified with the patient she has appt on 08/14/20 with the Health Coach and PCP on 08/15/20 The patient would like her appt. On 08/14/20 to be Virtual.  Please note. She prefers not to come in the office 2 days in a row. Sent a teams message to health coach as well.

## 2020-08-05 NOTE — Telephone Encounter (Signed)
Caller states not sure if has appt today or next week; my chart says next week; but text message says today; Thurs and Fri.  Telephone: [phone]

## 2020-08-14 ENCOUNTER — Ambulatory Visit: Payer: Self-pay

## 2020-08-15 ENCOUNTER — Other Ambulatory Visit: Payer: Self-pay

## 2020-08-15 ENCOUNTER — Ambulatory Visit (INDEPENDENT_AMBULATORY_CARE_PROVIDER_SITE_OTHER): Payer: Medicare Other | Admitting: Family Medicine

## 2020-08-15 ENCOUNTER — Encounter: Payer: Self-pay | Admitting: Family Medicine

## 2020-08-15 VITALS — BP 120/80 | HR 72 | Temp 97.8°F | Ht 69.0 in | Wt 143.4 lb

## 2020-08-15 DIAGNOSIS — E7800 Pure hypercholesterolemia, unspecified: Secondary | ICD-10-CM

## 2020-08-15 LAB — COMPREHENSIVE METABOLIC PANEL
ALT: 17 U/L (ref 0–35)
AST: 15 U/L (ref 0–37)
Albumin: 4.3 g/dL (ref 3.5–5.2)
Alkaline Phosphatase: 61 U/L (ref 39–117)
BUN: 15 mg/dL (ref 6–23)
CO2: 30 mEq/L (ref 19–32)
Calcium: 10 mg/dL (ref 8.4–10.5)
Chloride: 103 mEq/L (ref 96–112)
Creatinine, Ser: 0.75 mg/dL (ref 0.40–1.20)
GFR: 87.33 mL/min (ref >60.00)
Glucose, Bld: 90 mg/dL (ref 70–99)
Potassium: 5.1 mEq/L (ref 3.5–5.1)
Sodium: 139 mEq/L (ref 135–145)
Total Bilirubin: 0.6 mg/dL (ref 0.2–1.2)
Total Protein: 6.6 g/dL (ref 6.0–8.3)

## 2020-08-15 LAB — LIPID PANEL
Cholesterol: 204 mg/dL — ABNORMAL HIGH (ref 0–200)
HDL: 75.8 mg/dL (ref >39.00)
LDL Cholesterol: 108 mg/dL — ABNORMAL HIGH (ref 0–99)
NonHDL: 128.28
Total CHOL/HDL Ratio: 3
Triglycerides: 99 mg/dL (ref 0.0–149.0)
VLDL: 19.8 mg/dL (ref 0.0–40.0)

## 2020-08-15 NOTE — Progress Notes (Signed)
Chief Complaint  Patient presents with  . Annual Exam    Subjective: Hyperlipidemia Patient presents for Hyperlipidemia follow up. Currently not on medication.  She is adhering to a healthy diet. Exercise: active with work The patient is not known to have coexisting coronary artery disease. No CP or SOB.   Past Medical History:  Diagnosis Date  . Chicken pox   . Frequent headaches   . History of fainting spells of unknown cause   . Migraines   . Urinary tract infection     Objective: BP 120/80 (BP Location: Left Arm, Patient Position: Sitting, Cuff Size: Normal)   Pulse 72   Temp 97.8 F (36.6 C) (Oral)   Ht 5\' 9"  (1.753 m)   Wt 143 lb 6 oz (65 kg)   LMP  (LMP Unknown)   SpO2 98%   BMI 21.17 kg/m  General: Awake, appears stated age HEENT: MMM Heart: RRR, no LE edema, no bruits Lungs: CTAB, no rales, wheezes or rhonchi. No accessory muscle use Psych: Age appropriate judgment and insight, normal affect and mood  Assessment and Plan: Pure hypercholesterolemia - Plan: Comprehensive metabolic panel, Lipid panel  Monitor labs. Counseled on diet/exercise. Rec'd covid vaccine. Pt does not want.  Rec'd Shingrix. She will have to look into ins coverage.   F/u in 1 yr or prn. The patient voiced understanding and agreement to the plan.  [place], DO 08/15/20  10:02 AM

## 2020-08-15 NOTE — Patient Instructions (Addendum)
Give us 2-3 business days to get the results of your labs back.   Keep the diet clean and stay active.  The new Shingrix vaccine (for shingles) is a 2 shot series. It can make people feel low energy, achy and almost like they have the flu for 48 hours after injection. Please plan accordingly when deciding on when to get this shot. Call your pharmacy to inquire about getting this. The second shot of the series is less severe regarding the side effects, but it still lasts 48 hours.   Let us know if you need anything. 

## 2020-08-25 ENCOUNTER — Other Ambulatory Visit: Payer: Self-pay

## 2020-08-25 ENCOUNTER — Ambulatory Visit
Admission: RE | Admit: 2020-08-25 | Discharge: 2020-08-25 | Disposition: A | Discharge status: Home / Self Care | Payer: Medicare Other | Source: Ambulatory Visit | Attending: Family Medicine | Admitting: Family Medicine

## 2020-08-25 DIAGNOSIS — R921 Mammographic calcification found on diagnostic imaging of breast: Secondary | ICD-10-CM

## 2020-08-25 DIAGNOSIS — R922 Inconclusive mammogram: Secondary | ICD-10-CM | POA: Diagnosis not present

## 2020-09-17 ENCOUNTER — Emergency Department (HOSPITAL_BASED_OUTPATIENT_CLINIC_OR_DEPARTMENT_OTHER): Payer: Medicare Other

## 2020-09-17 ENCOUNTER — Other Ambulatory Visit: Payer: Self-pay

## 2020-09-17 ENCOUNTER — Telehealth: Payer: Self-pay | Admitting: Cardiology

## 2020-09-17 ENCOUNTER — Emergency Department (HOSPITAL_BASED_OUTPATIENT_CLINIC_OR_DEPARTMENT_OTHER)
Admission: EM | Admit: 2020-09-17 | Discharge: 2020-09-17 | Disposition: A | Discharge status: Home / Self Care | Payer: Medicare Other | Attending: Emergency Medicine | Admitting: Emergency Medicine

## 2020-09-17 ENCOUNTER — Encounter (HOSPITAL_BASED_OUTPATIENT_CLINIC_OR_DEPARTMENT_OTHER): Payer: Self-pay | Admitting: Emergency Medicine

## 2020-09-17 DIAGNOSIS — R0789 Other chest pain: Secondary | ICD-10-CM | POA: Diagnosis not present

## 2020-09-17 DIAGNOSIS — Z87891 Personal history of nicotine dependence: Secondary | ICD-10-CM | POA: Diagnosis not present

## 2020-09-17 DIAGNOSIS — R0602 Shortness of breath: Secondary | ICD-10-CM | POA: Diagnosis not present

## 2020-09-17 DIAGNOSIS — R079 Chest pain, unspecified: Secondary | ICD-10-CM

## 2020-09-17 DIAGNOSIS — R42 Dizziness and giddiness: Secondary | ICD-10-CM | POA: Diagnosis not present

## 2020-09-17 LAB — D-DIMER, QUANTITATIVE: D-Dimer, Quant: <0.27 ug/mL-FEU (ref 0.00–0.50)

## 2020-09-17 LAB — CBC WITH DIFFERENTIAL/PLATELET
Abs Immature Granulocytes: 0.03 10*3/uL (ref 0.00–0.07)
Basophils Absolute: 0.1 10*3/uL (ref 0.0–0.1)
Basophils Relative: 1 %
Eosinophils Absolute: 0.1 10*3/uL (ref 0.0–0.5)
Eosinophils Relative: 2 %
HCT: 47.2 % — ABNORMAL HIGH (ref 36.0–46.0)
Hemoglobin: 15.2 g/dL — ABNORMAL HIGH (ref 12.0–15.0)
Immature Granulocytes: 0 %
Lymphocytes Relative: 23 %
Lymphs Abs: 1.9 10*3/uL (ref 0.7–4.0)
MCH: 28.2 pg (ref 26.0–34.0)
MCHC: 32.2 g/dL (ref 30.0–36.0)
MCV: 87.6 fL (ref 80.0–100.0)
Monocytes Absolute: 0.6 10*3/uL (ref 0.1–1.0)
Monocytes Relative: 7 %
Neutro Abs: 5.6 10*3/uL (ref 1.7–7.7)
Neutrophils Relative %: 67 %
Platelets: 256 10*3/uL (ref 150–400)
RBC: 5.39 MIL/uL — ABNORMAL HIGH (ref 3.87–5.11)
RDW: 14.2 % (ref 11.5–15.5)
WBC: 8.2 10*3/uL (ref 4.0–10.5)
nRBC: 0 % (ref 0.0–0.2)

## 2020-09-17 LAB — COMPREHENSIVE METABOLIC PANEL
ALT: 16 U/L (ref 0–44)
AST: 23 U/L (ref 15–41)
Albumin: 3.6 g/dL (ref 3.5–5.0)
Alkaline Phosphatase: 50 U/L (ref 38–126)
Anion gap: 9 (ref 5–15)
BUN: 14 mg/dL (ref 6–20)
CO2: 26 mmol/L (ref 22–32)
Calcium: 9 mg/dL (ref 8.9–10.3)
Chloride: 104 mmol/L (ref 98–111)
Creatinine, Ser: 0.65 mg/dL (ref 0.44–1.00)
GFR, Estimated: >60 mL/min (ref >60)
Glucose, Bld: 100 mg/dL — ABNORMAL HIGH (ref 70–99)
Potassium: 3.7 mmol/L (ref 3.5–5.1)
Sodium: 139 mmol/L (ref 135–145)
Total Bilirubin: 0.7 mg/dL (ref 0.3–1.2)
Total Protein: 6 g/dL — ABNORMAL LOW (ref 6.5–8.1)

## 2020-09-17 LAB — TROPONIN I (HIGH SENSITIVITY)
Troponin I (High Sensitivity): 3 ng/L (ref <18)
Troponin I (High Sensitivity): 2 ng/L (ref <18)

## 2020-09-17 LAB — LIPASE, BLOOD: Lipase: 40 U/L (ref 11–51)

## 2020-09-17 MED ORDER — NITROGLYCERIN 0.4 MG SL SUBL
0.4000 mg | SUBLINGUAL_TABLET | Freq: Once | SUBLINGUAL | Status: AC
  Administered 2020-09-17: 0.4 mg via SUBLINGUAL
  Filled 2020-09-17: qty 1

## 2020-09-17 NOTE — Telephone Encounter (Signed)
Pt c/o of Chest Pain: STAT if CP now or developed within 24 hours  1. Are you having CP right now? yes  2. Are you experiencing any other symptoms (ex. SOB, nausea, vomiting, sweating)? Nausea, sweating, did not vomit, was breathing heavy and fast yesterday  3. How long have you been experiencing CP? Started about 2 weeks  4. Is your CP continuous or coming and going? Comes and goes  5. Have you taken Nitroglycerin? No, does not have it.   Patient states she thinks she had a heart attack yesterday. She states when she got up yesterday she had jaw pain, but went about ehr day normally. She states around noon she got nauseas and broke out into a sweat. She states she layed her head down for about a half an hour until she felt more alert. She states she has had chest tightness for about 2 weeks on and off. Her BP now is BP 150/107 now  ?

## 2020-09-17 NOTE — ED Triage Notes (Signed)
Reports left sided chest pressure that started this morning.  Does remember having jaw pain yesterday that has now resolved.  Also endorses some SOB and dizziness.

## 2020-09-17 NOTE — Telephone Encounter (Signed)
FYI  Called and spoke with pt and advised her that she needed to go to the Emergency dept. Pt wanted to be seen in the office. I called and spoke with Dr. Servando Salina who again advised that the pt needed to go to the ED. I returned to the call and reiterated the importance of going to the ED and that the sooner she is seen the better. Pt verbalized understanding and stated that she will go to the ED at [facility].

## 2020-09-17 NOTE — ED Provider Notes (Signed)
[facility] EMERGENCY DEPARTMENT Provider Note   CSN: [id number] Arrival date & time: 09/17/20  2536     History Chief Complaint  Patient presents with  . Chest Pain    Brenda Curry is a 60 y.o. female.  The history is provided by the patient.  Chest Pain Pain location:  L chest Pain quality: aching   Pain severity:  Mild Onset quality:  Gradual Duration:  4 hours Timing:  Constant Progression:  Unchanged Chronicity:  New Context: at rest   Relieved by:  Nothing Worsened by:  Nothing Associated symptoms: no abdominal pain, no anxiety, no back pain, no cough, no fever, no palpitations, no shortness of breath and no vomiting   Risk factors: no coronary artery disease, no high cholesterol, no hypertension, no prior DVT/PE and no smoking        Past Medical History:  Diagnosis Date  . Chicken pox   . Frequent headaches   . History of fainting spells of unknown cause   . Migraines   . Urinary tract infection     Patient Active Problem List   Diagnosis Date Noted  . Addison disease (HCC) 08/15/2019  . Acute cystitis with hematuria 06/09/2018  . Adrenal insufficiency (HCC) 03/23/2018  . Chronic bilateral low back pain without sciatica 03/23/2018    Past Surgical History:  Procedure Laterality Date  . BACK SURGERY  2007  . BACK SURGERY  2009  . NECK SURGERY  2007     OB History   No obstetric history on file.     Family History  Problem Relation Age of Onset  . Hearing loss Mother   . Hyperlipidemia Mother   . Aortic aneurysm Mother   . Biliary Cirrhosis Mother   . Cancer Father   . Heart attack Father   . Heart disease Father   . High Cholesterol Father   . Hypertension Father   . Aortic aneurysm Father   . Hashimoto's thyroiditis Sister   . Heart disease Brother   . Atrial fibrillation Brother   . Diabetes Maternal Grandmother   . Alcohol abuse Paternal Grandfather   . Cancer Paternal Grandfather   . Heart disease  Paternal Grandfather   . Diabetes Brother   . Hyperlipidemia Brother   . Obesity Brother   . Thyroid disease Cousin     Social History   Tobacco Use  . Smoking status: Former Smoker    Years: 36.00    Types: Cigarettes    Quit date: 09/27/2018    Years since quitting: 1.9  . Smokeless tobacco: Never Used  Vaping Use  . Vaping Use: Former  Substance Use Topics  . Alcohol use: Yes    Comment: rarely  . Drug use: Never    Home Medications Prior to Admission medications   Medication Sig Start Date End Date Taking? Authorizing Provider  fludrocortisone (FLORINEF) 0.1 MG tablet Take 1 tablet (0.1 mg total) by mouth daily. 07/13/19   Baldo Daub, MD  ibuprofen (ADVIL,MOTRIN) 200 MG tablet Take 200 mg by mouth every 6 (six) hours as needed.    [provider]  midodrine (PROAMATINE) 5 MG tablet Take 1 tablet (5 mg total) by mouth 2 (two) times daily. 09/07/19   Baldo Daub, MD    Allergies    Macrobid [nitrofurantoin macrocrystal] and Tape  Review of Systems   Review of Systems  Constitutional: Negative for chills and fever.  HENT: Negative for ear pain and sore throat.  Eyes: Negative for pain and visual disturbance.  Respiratory: Negative for cough and shortness of breath.   Cardiovascular: Positive for chest pain. Negative for palpitations.  Gastrointestinal: Negative for abdominal pain and vomiting.  Genitourinary: Negative for dysuria and hematuria.  Musculoskeletal: Negative for arthralgias and back pain.  Skin: Negative for color change and rash.  Neurological: Negative for seizures and syncope.  All other systems reviewed and are negative.   Physical Exam Updated Vital Signs  ED Triage Vitals  Enc Vitals Group     BP 09/17/20 0936 (!) 163/98     Pulse Rate 09/17/20 0936 75     Resp 09/17/20 0936 18     Temp 09/17/20 0936 97.9 F (36.6 C)     Temp Source 09/17/20 0936 Oral     SpO2 09/17/20 0936 100 %     Weight 09/17/20 0937 145 lb (65.8 kg)      Height 09/17/20 0937 5\' 9"  (1.753 m)     Head Circumference --      Peak Flow --      Pain Score 09/17/20 0937 3     Pain Loc --      Pain Edu? --      Excl. in GC? --     Physical Exam Vitals and nursing note reviewed.  Constitutional:      General: She is not in acute distress.    Appearance: She is well-developed. She is not ill-appearing.  HENT:     Head: Normocephalic and atraumatic.  Eyes:     Conjunctiva/sclera: Conjunctivae normal.     Pupils: Pupils are equal, round, and reactive to light.  Cardiovascular:     Rate and Rhythm: Normal rate and regular rhythm.     Pulses:          Radial pulses are 2+ on the right side and 2+ on the left side.     Heart sounds: Normal heart sounds. No murmur heard.   Pulmonary:     Effort: Pulmonary effort is normal. No respiratory distress.     Breath sounds: Normal breath sounds. No decreased breath sounds, wheezing or rhonchi.  Abdominal:     Palpations: Abdomen is soft.     Tenderness: There is no abdominal tenderness.  Musculoskeletal:        General: Normal range of motion.     Cervical back: Normal range of motion and neck supple.     Right lower leg: No edema.     Left lower leg: No edema.  Skin:    General: Skin is warm and dry.     Capillary Refill: Capillary refill takes less than 2 seconds.  Neurological:     General: No focal deficit present.     Mental Status: She is alert.  Psychiatric:        Mood and Affect: Mood normal.     ED Results / Procedures / Treatments   Labs (all labs ordered are listed, but only abnormal results are displayed) Labs Reviewed  CBC WITH DIFFERENTIAL/PLATELET - Abnormal; Notable for the following components:      Result Value   RBC 5.39 (*)    Hemoglobin 15.2 (*)    HCT 47.2 (*)    All other components within normal limits  COMPREHENSIVE METABOLIC PANEL - Abnormal; Notable for the following components:   Glucose, Bld 100 (*)    Total Protein 6.0 (*)    All other  components within normal limits  LIPASE, BLOOD  D-DIMER, QUANTITATIVE  TROPONIN I (HIGH SENSITIVITY)  TROPONIN I (HIGH SENSITIVITY)    EKG EKG Interpretation  Date/Time:  Wednesday September 17 2020 09:32:56 EDT Ventricular Rate:  74 PR Interval:    QRS Duration: 91 QT Interval:  383 QTC Calculation: 425 R Axis:   67 Text Interpretation: Sinus rhythm Right atrial enlargement Confirmed by Virgina Norfolk [phone]) on 09/17/2020 9:35:15 AM   Radiology DG Chest Portable 1 View  Result Date: 09/17/2020 CLINICAL DATA:  LEFT side chest pressure since this morning, jaw pain yesterday which has resolved, some shortness of breath and dizziness EXAM: PORTABLE CHEST 1 VIEW COMPARISON:  Portable exam 0948 hours without priors for comparison FINDINGS: Normal heart size, mediastinal contours, and pulmonary vascularity. Lungs hyperinflated but clear. No pulmonary infiltrate, pleural effusion, or pneumothorax. Osseous structures unremarkable. IMPRESSION: No acute abnormalities. Electronically Signed   By: Ulyses Southward M.D.   On: 09/17/2020 10:45    Procedures Procedures   Medications Ordered in ED Medications  nitroGLYCERIN (NITROSTAT) SL tablet 0.4 mg (0.4 mg Sublingual Given 09/17/20 1001)    ED Course  I have reviewed the triage vital signs and the nursing notes.  Pertinent labs & imaging results that were available during my care of the patient were reviewed by me and considered in my medical decision making (see chart for details).    MDM Rules/Calculators/A&P                          Brenda Curry is a 60 year old female with no significant medical history presents the ED with chest pain.  Normal vitals.  No fever.  Chest pressure that started this morning for the last several hours.  No shortness of breath.  No obvious PE factors but will get a D-dimer.  Heart score is 2.  Will get troponins check basic labs including chest x-ray.  Does not have any infectious symptoms but will  evaluate for pneumonia.  Could be GI or MSK related.  Overall atypical story.  Troponin negative x2.  D-dimer negative.  Doubt PE.  Doubt ACS.  No significant anemia, electrolyte abnormality, kidney injury.  Gallbladder and liver enzymes within normal limits.  Doubt cholecystitis.  Doubt pancreatitis his lipase is normal.  Chest x-ray showed no signs of infection, no pneumonia, no pneumothorax.  Overall nonspecific chest pain.  Recommend rest and hydration and follow-up with primary care doctor.  Understands return precautions.  This chart was dictated using voice recognition software.  Despite best efforts to proofread,  errors can occur which can change the documentation meaning.    Final Clinical Impression(s) / ED Diagnoses Final diagnoses:  Nonspecific chest pain    Rx / DC Orders ED Discharge Orders    None       Virgina Norfolk, DO 09/17/20 1141

## 2020-09-26 DIAGNOSIS — G43909 Migraine, unspecified, not intractable, without status migrainosus: Secondary | ICD-10-CM | POA: Insufficient documentation

## 2020-09-26 DIAGNOSIS — B019 Varicella without complication: Secondary | ICD-10-CM | POA: Insufficient documentation

## 2020-09-26 DIAGNOSIS — R519 Headache, unspecified: Secondary | ICD-10-CM | POA: Insufficient documentation

## 2020-09-26 DIAGNOSIS — N390 Urinary tract infection, site not specified: Secondary | ICD-10-CM | POA: Insufficient documentation

## 2020-09-26 DIAGNOSIS — Z9189 Other specified personal risk factors, not elsewhere classified: Secondary | ICD-10-CM | POA: Insufficient documentation

## 2020-10-02 ENCOUNTER — Ambulatory Visit: Payer: Medicare Other

## 2020-10-09 ENCOUNTER — Ambulatory Visit: Payer: Medicare Other | Admitting: Cardiology

## 2020-11-05 ENCOUNTER — Ambulatory Visit (INDEPENDENT_AMBULATORY_CARE_PROVIDER_SITE_OTHER): Payer: Medicare Other

## 2020-11-05 ENCOUNTER — Encounter: Payer: Self-pay | Admitting: Cardiology

## 2020-11-05 ENCOUNTER — Other Ambulatory Visit: Payer: Self-pay

## 2020-11-05 ENCOUNTER — Ambulatory Visit (INDEPENDENT_AMBULATORY_CARE_PROVIDER_SITE_OTHER): Payer: Medicare Other | Admitting: Cardiology

## 2020-11-05 VITALS — BP 138/84 | HR 94 | Ht 69.0 in | Wt 143.0 lb

## 2020-11-05 DIAGNOSIS — R002 Palpitations: Secondary | ICD-10-CM

## 2020-11-05 DIAGNOSIS — I951 Orthostatic hypotension: Secondary | ICD-10-CM | POA: Diagnosis not present

## 2020-11-05 NOTE — Progress Notes (Signed)
Cardiology Office Note:    Date:  11/05/2020   ID:  Brenda Curry, DOB Jan 04, 1961, MRN [id number]  PCP:  Sharlene Dory, DO  Cardiologist:  Norman Herrlich, MD    Referring MD: Sharlene Dory*    ASSESSMENT:    1. Palpitations   2. Orthostatic hypotension    PLAN:    In order of problems listed above:  1. Palpitation has become a very prominent symptom we will repeat a 14-day monitor with a syncopal episode do not live looking for intermittent rapid atrial rhythm as well as bradycardia with the suggestion from her smart watch. 2. Continue Florinef and midodrine she continues these symptomatic and I encouraged her to use an abdominal binder.   Next appointment: 3 months   Medication Adjustments/Labs and Tests Ordered: Current medicines are reviewed at length with the patient today.  Concerns regarding medicines are outlined above.  No orders of the defined types were placed in this encounter.  No orders of the defined types were placed in this encounter.   Chief Complaint  Patient presents with  . Follow-up  . Hypotension    History of Present Illness:    Brenda Curry is a 60 y.o. female with a hx of symptomatic hypotension and syncope treated with an alpha agonist  last seen 09/07/2019.  Been seen by endocrinology with a normal Cortrosyn stimulation test and not felt to require replacement therapy.  Compliance with diet, lifestyle and medications: Yes  Despite Florinef and midodrine she still has episodes of lightheadedness and unfortunate an episode of syncope after prolonged standing in a restaurant  I reviewed her history with her she was not having chest pain when she was seen in the ED She is having frequent episodes of rapid forceful heart rhythm that was associated with her episode of syncope. When she felt so badly in the restaurant her watch told her heart rate was 32 bpm. She is not having exertional angina  dyspnea. .  Seen [facility] ED for chest pain 09/17/2020.  Her EKG showed sinus rhythm no ischemic changes chest x-ray was read as normal D-dimer was low less than 0.27 CMP was normal except for glucose of 100 and total protein diminished at 6 high-sensitivity troponin was quite low at 3 she was felt to not have nonspecific chest pain and was discharged from the hospital.  Echocardiogram 08/06/2019: Normal  1. Left ventricular ejection fraction, by estimation, is 60 to 65%. The  left ventricle has normal function. Left ventricular diastolic parameters  are consistent with Grade I diastolic dysfunction (impaired relaxation).   2. Right ventricular systolic function is normal. There is normal  pulmonary artery systolic pressure.   3. The aortic valve is tricuspid.    Extended ambulatory heart rhythm monitor: Unremarkable there were few episodes of brief runs of atrial premature contractions. Study Highlights A ZIO monitor was performed for 14 days beginning 07/19/2019 to assess palpitation. The heart rhythm throughout was sinus with minimum, average and maximum heart rates of 46, 70 and 150 bpm. There were no pauses of 3 seconds or greater and no episodes of AV nodal block or sinus node exit block. Ventricular ectopy was rare including 2 couplets and one triplet. Supraventricular ectopy was rare without episodes of atrial fibrillation or flutter.  There were 4 brief runs of APCs the longest 6 complexes at 97 bpm ectopic atrial rhythm and the fastest for complexes a rate of 158.  There was a brief episode  of disorganized atrial rhythm 6 complexes right bundle branch block morphology that was incorrectly labeled as ventricular tachycardia.   There were 4 triggered and 3 diary events all associated with ectopy except for one sinus rhythm 1 was the presence of single PVC the others were supraventricular ectopy with up to 3 APCs in a row   Past Medical History:  Diagnosis Date  . Chicken  pox   . Frequent headaches   . History of fainting spells of unknown cause   . Migraines   . Urinary tract infection     Past Surgical History:  Procedure Laterality Date  . BACK SURGERY  2007  . BACK SURGERY  2009  . NECK SURGERY  2007    Current Medications: Current Meds  Medication Sig  . ibuprofen (ADVIL,MOTRIN) 200 MG tablet Take 200 mg by mouth every 6 (six) hours as needed for moderate pain.  . midodrine (PROAMATINE) 5 MG tablet Take 1 tablet (5 mg total) by mouth 2 (two) times daily.     Allergies:   Tape   Social History   Socioeconomic History  . Marital status: Divorced    Spouse name: Not on file  . Number of children: Not on file  . Years of education: Not on file  . Highest education level: Not on file  Occupational History  . Not on file  Tobacco Use  . Smoking status: Former Smoker    Years: 36.00    Types: Cigarettes    Quit date: 09/27/2018    Years since quitting: 2.1  . Smokeless tobacco: Never Used  Vaping Use  . Vaping Use: Former  Substance and Sexual Activity  . Alcohol use: Yes    Comment: rarely  . Drug use: Never  . Sexual activity: Not Currently  Other Topics Concern  . Not on file  Social History Narrative  . Not on file   Social Determinants of Health   Financial Resource Strain: Not on file  Food Insecurity: Not on file  Transportation Needs: Not on file  Physical Activity: Not on file  Stress: Not on file  Social Connections: Not on file     Family History: The patient's family history includes Alcohol abuse in her paternal grandfather; Aortic aneurysm in her father and mother; Atrial fibrillation in her brother; Biliary Cirrhosis in her mother; Cancer in her father and paternal grandfather; Diabetes in her brother and maternal grandmother; Hashimoto's thyroiditis in her sister; Hearing loss in her mother; Heart attack in her father; Heart disease in her brother, father, and paternal grandfather; High Cholesterol in her  father; Hyperlipidemia in her brother and mother; Hypertension in her father; Obesity in her brother; Thyroid disease in her cousin. ROS:   Please see the history of present illness.    All other systems reviewed and are negative.  EKGs/Labs/Other Studies Reviewed:    The following studies were reviewed today:   Recent Labs: 09/17/2020: ALT 16; BUN 14; Creatinine, Ser 0.65; Hemoglobin 15.2; Platelets 256; Potassium 3.7; Sodium 139  Recent Lipid Panel    Component Value Date/Time   CHOL 204 (H) 08/15/2020 1003   TRIG 99.0 08/15/2020 1003   HDL 75.80 08/15/2020 1003   CHOLHDL 3 08/15/2020 1003   VLDL 19.8 08/15/2020 1003   LDLCALC 108 (H) 08/15/2020 1003    Physical Exam:    VS:  BP 138/84   Pulse 94   Ht 5\' 9"  (1.753 m)   Wt 143 lb (64.9 kg)   LMP  (  LMP Unknown)   SpO2 99%   BMI 21.12 kg/m     Wt Readings from Last 3 Encounters:  11/05/20 143 lb (64.9 kg)  09/17/20 145 lb (65.8 kg)  08/15/20 143 lb 6 oz (65 kg)     GEN:  Well nourished, well developed in no acute distress HEENT: Normal NECK: No JVD; No carotid bruits LYMPHATICS: No lymphadenopathy CARDIAC: RRR, no murmurs, rubs, gallops RESPIRATORY:  Clear to auscultation without rales, wheezing or rhonchi  ABDOMEN: Soft, non-tender, non-distended MUSCULOSKELETAL:  No edema; No deformity  SKIN: Warm and dry NEUROLOGIC:  Alert and oriented x 3 PSYCHIATRIC:  Normal affect    Signed, Norman Herrlich, MD  11/05/2020 2:26 PM    [facility]

## 2020-11-05 NOTE — Patient Instructions (Addendum)
Medication Instructions:  Your physician recommends that you continue on your current medications as directed. Please refer to the Current Medication list given to you today.  *If you need a refill on your cardiac medications before your next appointment, please call your pharmacy*   Lab Work: None If you have labs (blood work) drawn today and your tests are completely normal, you will receive your results only by: Marland Kitchen MyChart Message (if you have MyChart) OR . A paper copy in the mail If you have any lab test that is abnormal or we need to change your treatment, we will call you to review the results.   Testing/Procedures: A zio monitor was ordered today. It will remain on for 14 days. You will then return monitor and event diary in provided box. It takes 1-2 weeks for report to be downloaded and returned to [place]. We will call you with the results. If monitor falls off or has orange flashing light, please call Zio for further instructions.      Follow-Up: At [facility], you and your health needs are our priority.  As part of our continuing mission to provide you with exceptional heart care, we have created designated Provider Care Teams.  These Care Teams include your primary Cardiologist (physician) and Advanced Practice Providers (APPs -  Physician Assistants and Nurse Practitioners) who all work together to provide you with the care you need, when you need it.  We recommend signing up for the patient portal called "MyChart".  Sign up information is provided on this After Visit Summary.  MyChart is used to connect with patients for Virtual Visits (Telemedicine).  Patients are able to view lab/test results, encounter notes, upcoming appointments, etc.  Non-urgent messages can be sent to your provider as well.   To learn more about what you can do with MyChart, go to [email or web address].    Your next appointment:   3 month(s)  The format for your next appointment:   In  Person  Provider:   Norman Herrlich, MD   Other Instructions Purchase and wear an abdominal binder.

## 2020-11-06 ENCOUNTER — Ambulatory Visit: Payer: Medicare Other

## 2020-11-06 DIAGNOSIS — R002 Palpitations: Secondary | ICD-10-CM | POA: Diagnosis not present

## 2020-11-18 ENCOUNTER — Encounter: Payer: Self-pay | Admitting: Family Medicine

## 2020-11-18 ENCOUNTER — Other Ambulatory Visit: Payer: Self-pay

## 2020-11-18 ENCOUNTER — Ambulatory Visit (INDEPENDENT_AMBULATORY_CARE_PROVIDER_SITE_OTHER): Payer: Medicare Other | Admitting: Family Medicine

## 2020-11-18 VITALS — BP 110/68 | HR 102 | Temp 97.7°F | Ht 69.0 in | Wt 143.0 lb

## 2020-11-18 DIAGNOSIS — N3000 Acute cystitis without hematuria: Secondary | ICD-10-CM

## 2020-11-18 LAB — POC URINALSYSI DIPSTICK (AUTOMATED)
Bilirubin, UA: NEGATIVE
Blood, UA: NEGATIVE
Glucose, UA: NEGATIVE
Nitrite, UA: POSITIVE
Protein, UA: POSITIVE — AB
Spec Grav, UA: >=1.03 — AB (ref 1.010–1.025)
Urobilinogen, UA: 0.2 E.U./dL
pH, UA: 6 (ref 5.0–8.0)

## 2020-11-18 MED ORDER — NITROFURANTOIN MONOHYD MACRO 100 MG PO CAPS: 100.0000 mg | ORAL_CAPSULE | Freq: Two times a day (BID) | ORAL | 0 refills | Status: AC

## 2020-11-18 NOTE — Patient Instructions (Signed)
Stay hydrated.   Warning signs/symptoms: Uncontrollable nausea/vomiting, fevers, worsening symptoms despite treatment, confusion.  Give us around 2 business days to get culture back to you.  Let us know if you need anything. 

## 2020-11-18 NOTE — Progress Notes (Signed)
Chief Complaint  Patient presents with  . Dysuria    Brenda Curry is a 60 y.o. female here for possible UTI.  Duration: 6 days. Symptoms: Dysuria, urinary frequency, urinary retention and urgency, dark/odorous urine Denies: hematuria, urinary hesitancy, fever, nausea, vomiting and flank pain, vaginal discharge Hx of recurrent UTI? No Denies new sexual partners.  Past Medical History:  Diagnosis Date  . Chicken pox   . Frequent headaches   . History of fainting spells of unknown cause   . Migraines   . Urinary tract infection      BP 110/68 (BP Location: Left Arm, Patient Position: Sitting, Cuff Size: Normal)   Pulse (!) 102   Temp 97.7 F (36.5 C) (Oral)   Ht 5\' 9"  (1.753 m)   Wt 143 lb (64.9 kg)   LMP  (LMP Unknown)   SpO2 98%   BMI 21.12 kg/m  General: Awake, alert, appears stated age Heart: RRR Lungs: CTAB, normal respiratory effort, no accessory muscle usage Abd: BS+, soft, NT, ND, no masses or organomegaly MSK: No CVA tenderness, neg Lloyd's sign Psych: Age appropriate judgment and insight  Acute cystitis without hematuria - Plan: nitrofurantoin, macrocrystal-monohydrate, (MACROBID) 100 MG capsule  5 d course of above.  Stay hydrated. Seek immediate care if pt starts to develop fevers, new/worsening symptoms, uncontrollable N/V. F/u prn. The patient voiced understanding and agreement to the plan.  Goodwin, DO 11/18/20 3:45 PM

## 2020-11-18 NOTE — Addendum Note (Signed)
Addended by: Scharlene Gloss B on: 11/18/2020 03:47 PM   Modules accepted: Orders

## 2020-11-20 LAB — URINE CULTURE
MICRO NUMBER:: 11928244
SPECIMEN QUALITY:: ADEQUATE

## 2020-12-01 ENCOUNTER — Telehealth: Payer: Self-pay

## 2020-12-01 NOTE — Telephone Encounter (Signed)
Left message on patients voicemail to please return our call.   

## 2020-12-01 NOTE — Telephone Encounter (Signed)
-----   Message from Brian J Munley, MD sent at 11/30/2020  8:40 PM EDT ----- This is a good result Rare extra beats 

## 2020-12-01 NOTE — Telephone Encounter (Signed)
Spoke with patient regarding results and recommendation.  Patient verbalizes understanding and is agreeable to plan of care. Advised patient to call back with any issues or concerns.  

## 2020-12-01 NOTE — Telephone Encounter (Signed)
-----   Message from Baldo Daub, MD sent at 11/30/2020  8:40 PM EDT ----- This is a good result Rare extra beats

## 2020-12-30 ENCOUNTER — Other Ambulatory Visit: Payer: Self-pay | Admitting: Family Medicine

## 2020-12-30 MED ORDER — CEPHALEXIN 500 MG PO CAPS: 500.0000 mg | ORAL_CAPSULE | Freq: Four times a day (QID) | ORAL | 0 refills | Status: DC

## 2021-02-05 ENCOUNTER — Ambulatory Visit: Payer: Medicare Other | Admitting: Cardiology

## 2021-03-27 ENCOUNTER — Ambulatory Visit: Payer: Medicare Other | Admitting: Cardiology

## 2021-04-09 ENCOUNTER — Encounter: Payer: Self-pay | Admitting: Cardiology

## 2021-04-09 ENCOUNTER — Ambulatory Visit (INDEPENDENT_AMBULATORY_CARE_PROVIDER_SITE_OTHER): Payer: Medicare Other | Admitting: Cardiology

## 2021-04-09 ENCOUNTER — Other Ambulatory Visit: Payer: Self-pay

## 2021-04-09 VITALS — BP 120/60 | HR 100 | Ht 69.0 in | Wt 147.4 lb

## 2021-04-09 DIAGNOSIS — R55 Syncope and collapse: Secondary | ICD-10-CM | POA: Diagnosis not present

## 2021-04-09 DIAGNOSIS — E2740 Unspecified adrenocortical insufficiency: Secondary | ICD-10-CM | POA: Diagnosis not present

## 2021-04-09 DIAGNOSIS — R002 Palpitations: Secondary | ICD-10-CM

## 2021-04-09 DIAGNOSIS — I951 Orthostatic hypotension: Secondary | ICD-10-CM | POA: Diagnosis not present

## 2021-04-09 MED ORDER — FLUDROCORTISONE ACETATE 0.1 MG PO TABS: 0.1000 mg | ORAL_TABLET | Freq: Every day | ORAL | 3 refills | Status: AC

## 2021-04-09 MED ORDER — MIDODRINE HCL 5 MG PO TABS: 5.0000 mg | ORAL_TABLET | Freq: Two times a day (BID) | ORAL | 2 refills | Status: AC

## 2021-04-09 NOTE — Progress Notes (Signed)
Cardiology Office Note:    Date:  04/09/2021   ID:  Brenda Curry, DOB 1960/11/22, MRN [id number]  PCP:  Sharlene Dory, DO  Cardiologist:  Norman Herrlich, MD    Referring MD: Sharlene Dory*    ASSESSMENT:    1. Orthostatic hypotension   2. Syncope and collapse   3. Palpitations    PLAN:    In order of problems listed above:  She is improved no further episodes continue Florinef and midodrine as needed I will plan to see back in the office in 1 year or sooner if necessary.   Next appointment: 1 year   Medication Adjustments/Labs and Tests Ordered: Current medicines are reviewed at length with the patient today.  Concerns regarding medicines are outlined above.  No orders of the defined types were placed in this encounter.  No orders of the defined types were placed in this encounter.   Chief Complaint  Patient presents with   Hypotension   Palpitations     History of Present Illness:    Brenda Curry is a 60 y.o. female with a hx of orthostatic hypotension and syncope requiring alpha agonist last seen 11/05/2020 she has been seen by endocrinology had a normal Cortrosyn stimulation test..  Compliance with diet, lifestyle and medications: Yes  She is doing much better no further episodes of orthostatic hypotension or syncope is taking midodrine as needed.  Palpitation is resolved.  Her event monitor showed no significant bradycardia.  Echocardiogram 08/06/2019: Normal  1. Left ventricular ejection fraction, by estimation, is 60 to 65%. The  left ventricle has normal function. Left ventricular diastolic parameters  are consistent with Grade I diastolic dysfunction (impaired relaxation).   2. Right ventricular systolic function is normal. There is normal  pulmonary artery systolic pressure.   3. The aortic valve is tricuspid.    Extended ambulatory heart rhythm monitor: Unremarkable there were few episodes of brief runs of atrial  premature contractions. Study Highlights A ZIO monitor was performed for 14 days beginning 07/19/2019 to assess palpitation. The heart rhythm throughout was sinus with minimum, average and maximum heart rates of 46, 70 and 150 bpm. There were no pauses of 3 seconds or greater and no episodes of AV nodal block or sinus node exit block. Ventricular ectopy was rare including 2 couplets and one triplet. Supraventricular ectopy was rare without episodes of atrial fibrillation or flutter.  There were 4 brief runs of APCs the longest 6 complexes at 97 bpm ectopic atrial rhythm and the fastest for complexes a rate of 158.  There was a brief episode of disorganized atrial rhythm 6 complexes right bundle branch block morphology that was incorrectly labeled as ventricular tachycardia.   There were 4 triggered and 3 diary events all associated with ectopy except for one sinus rhythm 1 was the presence of single PVC the others were supraventricular ectopy with up to 3 APCs in a row   There was concern of bradycardia from apple watch showing heart rate of 32 and she underwent a repeat extended monitor reported 11/28/2020 showed no bradycardia and no episodes of heart block or sinus node exit block ventricular and supraventricular arrhythmia was rare and triggered events were associated with PVCs Past Medical History:  Diagnosis Date   Chicken pox    Frequent headaches    History of fainting spells of unknown cause    Migraines    Urinary tract infection     Past Surgical History:  Procedure Laterality  Date   BACK SURGERY  2007   BACK SURGERY  2009   NECK SURGERY  2007    Current Medications: No outpatient medications have been marked as taking for the 04/09/21 encounter (Office Visit) with Baldo Daub, MD.     Allergies:   Bactrim [sulfamethoxazole-trimethoprim] and Tape   Social History   Socioeconomic History   Marital status: Divorced    Spouse name: Not on file   Number of children:  Not on file   Years of education: Not on file   Highest education level: Not on file  Occupational History   Not on file  Tobacco Use   Smoking status: Former    Years: 36.00    Types: Cigarettes    Quit date: 09/27/2018    Years since quitting: 2.5   Smokeless tobacco: Never  Vaping Use   Vaping Use: Former  Substance and Sexual Activity   Alcohol use: Yes    Comment: rarely   Drug use: Never   Sexual activity: Not Currently  Other Topics Concern   Not on file  Social History Narrative   Not on file   Social Determinants of Health   Financial Resource Strain: Not on file  Food Insecurity: Not on file  Transportation Needs: Not on file  Physical Activity: Not on file  Stress: Not on file  Social Connections: Not on file     Family History: The patient's family history includes Alcohol abuse in her paternal grandfather; Aortic aneurysm in her father and mother; Atrial fibrillation in her brother; Biliary Cirrhosis in her mother; Cancer in her father and paternal grandfather; Diabetes in her brother and maternal grandmother; Hashimoto's thyroiditis in her sister; Hearing loss in her mother; Heart attack in her father; Heart disease in her brother, father, and paternal grandfather; High Cholesterol in her father; Hyperlipidemia in her brother and mother; Hypertension in her father; Obesity in her brother; Thyroid disease in her cousin. ROS:   Please see the history of present illness.    All other systems reviewed and are negative.  EKGs/Labs/Other Studies Reviewed:    The following studies were reviewed today:   Recent Labs: 09/17/2020: ALT 16; BUN 14; Creatinine, Ser 0.65; Hemoglobin 15.2; Platelets 256; Potassium 3.7; Sodium 139  Recent Lipid Panel    Component Value Date/Time   CHOL 204 (H) 08/15/2020 1003   TRIG 99.0 08/15/2020 1003   HDL 75.80 08/15/2020 1003   CHOLHDL 3 08/15/2020 1003   VLDL 19.8 08/15/2020 1003   LDLCALC 108 (H) 08/15/2020 1003    Physical  Exam:    VS:  LMP  (LMP Unknown)     Wt Readings from Last 3 Encounters:  11/18/20 143 lb (64.9 kg)  11/05/20 143 lb (64.9 kg)  09/17/20 145 lb (65.8 kg)     GEN:  Well nourished, well developed in no acute distress HEENT: Normal NECK: No JVD; No carotid bruits LYMPHATICS: No lymphadenopathy CARDIAC: RRR, no murmurs, rubs, gallops RESPIRATORY:  Clear to auscultation without rales, wheezing or rhonchi  ABDOMEN: Soft, non-tender, non-distended MUSCULOSKELETAL:  No edema; No deformity  SKIN: Warm and dry NEUROLOGIC:  Alert and oriented x 3 PSYCHIATRIC:  Normal affect    Signed, Norman Herrlich, MD  04/09/2021 3:46 PM    [facility]

## 2021-04-09 NOTE — Patient Instructions (Signed)

## 2021-05-15 ENCOUNTER — Encounter: Payer: Self-pay | Admitting: Family Medicine

## 2021-05-15 ENCOUNTER — Other Ambulatory Visit: Payer: Medicare Other

## 2021-05-15 ENCOUNTER — Telehealth: Payer: Self-pay

## 2021-05-15 ENCOUNTER — Telehealth: Payer: Self-pay | Admitting: Family Medicine

## 2021-05-15 ENCOUNTER — Other Ambulatory Visit: Payer: Self-pay

## 2021-05-15 ENCOUNTER — Telehealth (INDEPENDENT_AMBULATORY_CARE_PROVIDER_SITE_OTHER): Payer: Medicare Other | Admitting: Family Medicine

## 2021-05-15 DIAGNOSIS — R569 Unspecified convulsions: Secondary | ICD-10-CM

## 2021-05-15 MED ORDER — LEVETIRACETAM 500 MG PO TABS: 500.0000 mg | ORAL_TABLET | Freq: Two times a day (BID) | ORAL | 1 refills | Status: DC

## 2021-05-15 MED ORDER — ONDANSETRON 4 MG PO TBDP: 4.0000 mg | ORAL_TABLET | Freq: Three times a day (TID) | ORAL | 0 refills | Status: DC | PRN

## 2021-05-15 NOTE — Telephone Encounter (Signed)
Pt stated she donated plasma about two days ago and the max amount for her weight should be 690 ml, they ended up taking 821 ml. She has a seizure and passed out and today is still feeling lightheaded and just not good overall. She wanted to be seen today, but since there is no availability she was transferred to triage. Please advise.

## 2021-05-15 NOTE — Progress Notes (Signed)
Chief Complaint  Patient presents with   Dizziness   Nausea    Subjective: Patient is a 60 y.o. female here for a possible seizure. Due to COVID-19 pandemic, we are interacting via telephone. I verified patient's ID using 2 identifiers. Patient agreed to proceed with visit via this method. Patient is at home, I am at office. Patient and I are present for visit.   Pt routinely donates plasma.  2 days ago they took more volume than they usually do.  She felt a little nauseated and lightheaded afterwards.  She went out to eat with a friend and reportedly passed out/lost consciousness for 5 to 6 minutes.  Staff and her friend tried to awaken her but they could not.  She was reportedly convulsing.  The patient lost control of her bowel and bladder function.  She did not bite her tongue.  She has never had a seizure or EEG before.  When she woke up, she was "out of it" for several hours.  Since that time, she has felt fatigued, nauseous, and lightheaded.  She has not been ill recently and denies any fevers.  She denies any head trauma.  Past Medical History:  Diagnosis Date   Chicken pox    Frequent headaches    History of fainting spells of unknown cause    Migraines    Urinary tract infection     Objective: No conversational dyspnea Age appropriate judgment and insight Nml affect and mood  Assessment and Plan: Seizure-like activity (HCC) - Plan: ondansetron (ZOFRAN-ODT) 4 MG disintegrating tablet, CBC, Comprehensive metabolic panel, levETIRAcetam (KEPPRA) 500 MG tablet, Ambulatory referral to Neurology  Given the loss of bowel and bladder function, I would like her to see neurology for further work-up.  I would also like her to start Keppra 500 mg twice daily for prophylaxis.  Zofran as needed for nausea.  We will also check some blood work to ensure no electrolyte abnormalities or blood count aberrations. Total time: 12 minutes The patient voiced understanding and agreement to the  plan.  Jilda Roche [place], DO 05/15/21  12:00 PM

## 2021-05-15 NOTE — Telephone Encounter (Signed)
1145. Can be virtual if more convenient, I can order labs/other in future.

## 2021-05-15 NOTE — Telephone Encounter (Signed)
Triage called stating that pt donates plasma twice a week. This Wednesday she went and donated, shortly after she had what her family member describes as a grandma seizure, she also had vomiting, nausea, dizziness, and lightheadedness. She did not go to the ER.   Pt is still having dizziness and nausea. Both the nurse and I agree that she should go to the ER, especially since the office has no openings and I did not feel comfortable with scheduling her out next week with her given sxs.

## 2021-05-15 NOTE — Telephone Encounter (Signed)
Called and she agreed to be scheduled at 11:45 today.

## 2021-05-15 NOTE — Addendum Note (Signed)
Addended by: Scharlene Gloss B on: 05/15/2021 12:48 PM   Modules accepted: Orders

## 2021-05-16 LAB — CBC
HCT: 39.9 % (ref 35.0–45.0)
Hemoglobin: 12.8 g/dL (ref 11.7–15.5)
MCH: 28.5 pg (ref 27.0–33.0)
MCHC: 32.1 g/dL (ref 32.0–36.0)
MCV: 88.9 fL (ref 80.0–100.0)
MPV: 10.9 fL (ref 7.5–12.5)
Platelets: 274 10*3/uL (ref 140–400)
RBC: 4.49 10*6/uL (ref 3.80–5.10)
RDW: 13.1 % (ref 11.0–15.0)
WBC: 8.8 10*3/uL (ref 3.8–10.8)

## 2021-05-16 LAB — COMPREHENSIVE METABOLIC PANEL
AG Ratio: 2.2 (calc) (ref 1.0–2.5)
ALT: 10 U/L (ref 6–29)
AST: 12 U/L (ref 10–35)
Albumin: 3.5 g/dL — ABNORMAL LOW (ref 3.6–5.1)
Alkaline phosphatase (APISO): 46 U/L (ref 37–153)
BUN: 24 mg/dL (ref 7–25)
CO2: 26 mmol/L (ref 20–32)
Calcium: 9 mg/dL (ref 8.6–10.4)
Chloride: 102 mmol/L (ref 98–110)
Creat: 0.84 mg/dL (ref 0.50–1.03)
Globulin: 1.6 g/dL (calc) — ABNORMAL LOW (ref 1.9–3.7)
Glucose, Bld: 166 mg/dL — ABNORMAL HIGH (ref 65–99)
Potassium: 4.5 mmol/L (ref 3.5–5.3)
Sodium: 138 mmol/L (ref 135–146)
Total Bilirubin: 0.3 mg/dL (ref 0.2–1.2)
Total Protein: 5.1 g/dL — ABNORMAL LOW (ref 6.1–8.1)

## 2021-06-25 ENCOUNTER — Encounter: Payer: Self-pay | Admitting: Neurology

## 2021-06-25 ENCOUNTER — Ambulatory Visit (INDEPENDENT_AMBULATORY_CARE_PROVIDER_SITE_OTHER): Payer: Medicare Other | Admitting: Neurology

## 2021-06-25 VITALS — BP 123/78 | HR 87 | Ht 69.0 in | Wt 140.0 lb

## 2021-06-25 DIAGNOSIS — R569 Unspecified convulsions: Secondary | ICD-10-CM | POA: Diagnosis not present

## 2021-06-25 NOTE — Patient Instructions (Addendum)
Routine EEG  Discontinue Keppra  Discussed driving restriction  Follow up with your primary care doctor and return if worse   Per [facility] statutes, patients with seizures are not allowed to drive until they have been seizure-free for six months.  Other recommendations include using caution when using heavy equipment or power tools. Avoid working on ladders or at heights. Take showers instead of baths.  Do not swim alone.  Ensure the water temperature is not too high on the home water heater. Do not go swimming alone. Do not lock yourself in a room alone (i.e. bathroom). When caring for infants or small children, sit down when holding, feeding, or changing them to minimize risk of injury to the child.

## 2021-06-25 NOTE — Progress Notes (Signed)
[facility]  PATIENT: Brenda Curry DOB: Mar 15, 1961  REQUESTING CLINICIAN: Sharlene Dory* HISTORY FROM: Patient  REASON FOR VISIT: A single seizure after donating plasma   HISTORICAL  CHIEF COMPLAINT:  Chief Complaint  Patient presents with   New Patient (Initial Visit)    RM 15, ALONE NP Internal referral for seizure like activity Today c/o fatigue and daily headache, doing well on Keppra 500mg  BID    HISTORY OF PRESENT ILLNESS:  This is a 60 year old woman with past medical history of Addison's disease who is presenting after 1 single seizure on 11/16 after donating plasma.  Patient reported she has been donating plasma for many time and she went to a new place back in November.  In November, one week prior to her seizure, they took 725 mL and she felt nauseous afterwards but no seizures.  On November 16 she went and they took 821 mL but based on her weight the maximum amount of fluid that they can take is 619 ml.  She reported afterward she felt nauseous, went to eat with a friend, and was told that her eyes rolled back, she fell to the ground and started seizing.  She had bowel and bladder incontinence.  EMS was called but she refused to go to the ED.  She said afterwards, she felt nauseous the entire day.  Since that event patient reported she does not feel right,  she is exhausted and has constant headache.  She has followed with her primary care doctor who started her on Keppra 500 mg twice daily.   Handedness: right handed   Seizure Type: unclear, syncope and jerk like   Current frequency: once   Any injuries from seizures: None   Seizure risk factors: None reported   Previous ASMs: None   Currenty ASMs: Keppra 500 mg BID   ASMs side effects: None reported   Brain Images: None   Previous EEGs:    OTHER MEDICAL CONDITIONS: Addison disease  REVIEW OF SYSTEMS: Full 14 system review of systems performed and negative with  exception of: as noted in the HPI  ALLERGIES: Allergies  Allergen Reactions   Bactrim [Sulfamethoxazole-Trimethoprim] Hives   Tape Other (See Comments)    blisters    HOME MEDICATIONS: Outpatient Medications Prior to Visit  Medication Sig Dispense Refill   fludrocortisone (FLORINEF) 0.1 MG tablet Take 1 tablet (0.1 mg total) by mouth daily. 90 tablet 3   ibuprofen (ADVIL,MOTRIN) 200 MG tablet Take 200 mg by mouth every 6 (six) hours as needed for moderate pain.     levETIRAcetam (KEPPRA) 500 MG tablet Take 1 tablet (500 mg total) by mouth 2 (two) times daily. 60 tablet 1   midodrine (PROAMATINE) 5 MG tablet Take 1 tablet (5 mg total) by mouth 2 (two) times daily. 180 tablet 2   ondansetron (ZOFRAN-ODT) 4 MG disintegrating tablet Take 1 tablet (4 mg total) by mouth every 8 (eight) hours as needed for nausea or vomiting. 20 tablet 0   No facility-administered medications prior to visit.    PAST MEDICAL HISTORY: Past Medical History:  Diagnosis Date   Chicken pox    Frequent headaches    History of fainting spells of unknown cause    Migraines    Urinary tract infection     PAST SURGICAL HISTORY: Past Surgical History:  Procedure Laterality Date   BACK SURGERY  2007   BACK SURGERY  2009   NECK SURGERY  2007    FAMILY HISTORY:  Family History  Problem Relation Age of Onset   Hearing loss Mother    Hyperlipidemia Mother    Aortic aneurysm Mother    Biliary Cirrhosis Mother    Cancer Father    Heart attack Father    Heart disease Father    High Cholesterol Father    Hypertension Father    Aortic aneurysm Father    Hashimoto's thyroiditis Sister    Heart disease Brother    Atrial fibrillation Brother    Diabetes Maternal Grandmother    Alcohol abuse Paternal Grandfather    Cancer Paternal Grandfather    Heart disease Paternal Grandfather    Diabetes Brother    Hyperlipidemia Brother    Obesity Brother    Thyroid disease Cousin     SOCIAL HISTORY: Social  History   Socioeconomic History   Marital status: Divorced    Spouse name: Not on file   Number of children: Not on file   Years of education: Not on file   Highest education level: Not on file  Occupational History   Not on file  Tobacco Use   Smoking status: Former    Years: 36.00    Types: Cigarettes    Quit date: 09/27/2018    Years since quitting: 2.7   Smokeless tobacco: Never  Vaping Use   Vaping Use: Former  Substance and Sexual Activity   Alcohol use: Yes    Comment: rarely   Drug use: Never   Sexual activity: Not Currently  Other Topics Concern   Not on file  Social History Narrative   Not on file   Social Determinants of Health   Financial Resource Strain: Not on file  Food Insecurity: Not on file  Transportation Needs: Not on file  Physical Activity: Not on file  Stress: Not on file  Social Connections: Not on file  Intimate Partner Violence: Not on file    PHYSICAL EXAM  GENERAL EXAM/CONSTITUTIONAL: Vitals:  Vitals:   06/25/21 1328  BP: 123/78  Pulse: 87  Weight: 140 lb (63.5 kg)  Height: 5\' 9"  (1.753 m)   Body mass index is 20.67 kg/m. Wt Readings from Last 3 Encounters:  06/25/21 140 lb (63.5 kg)  04/09/21 147 lb 6.4 oz (66.9 kg)  11/18/20 143 lb (64.9 kg)   Patient is in no distress; well developed, nourished and groomed; neck is supple  CARDIOVASCULAR: Examination of carotid arteries is normal; no carotid bruits Regular rate and rhythm, no murmurs Examination of peripheral vascular system by observation and palpation is normal  EYES: Pupils round and reactive to light, Visual fields full to confrontation, Extraocular movements intacts,  No results found.  MUSCULOSKELETAL: Gait, strength, tone, movements noted in Neurologic exam below  NEUROLOGIC: MENTAL STATUS:  MMSE - Mini Mental State Exam 06/29/2018  Orientation to time 5  Orientation to Place 5  Registration 3  Attention/ Calculation 5  Recall 1  Language- name 2 objects  2  Language- repeat 1  Language- follow 3 step command 3  Language- read & follow direction 1  Write a sentence 1  Copy design 1  Total score 28   awake, alert, oriented to person, place and time recent and remote memory intact normal attention and concentration language fluent, comprehension intact, naming intact fund of knowledge appropriate  CRANIAL NERVE:  2nd, 3rd, 4th, 6th - pupils equal and reactive to light, visual fields full to confrontation, extraocular muscles intact, no nystagmus 5th - facial sensation symmetric 7th - facial strength symmetric  8th - hearing intact 9th - palate elevates symmetrically, uvula midline 11th - shoulder shrug symmetric 12th - tongue protrusion midline  MOTOR:  normal bulk and tone, full strength in the BUE, BLE  SENSORY:  normal and symmetric to light touch, pinprick, temperature, vibration  COORDINATION:  finger-nose-finger, fine finger movements normal  REFLEXES:  deep tendon reflexes present and symmetric  GAIT/STATION:  normal   DIAGNOSTIC DATA (LABS, IMAGING, TESTING) - I reviewed patient records, labs, notes, testing and imaging myself where available.  Lab Results  Component Value Date   WBC 8.8 05/15/2021   HGB 12.8 05/15/2021   HCT 39.9 05/15/2021   MCV 88.9 05/15/2021   PLT 274 05/15/2021      Component Value Date/Time   NA 138 05/15/2021 1507   K 4.5 05/15/2021 1507   CL 102 05/15/2021 1507   CO2 26 05/15/2021 1507   GLUCOSE 166 (H) 05/15/2021 1507   BUN 24 05/15/2021 1507   CREATININE 0.84 05/15/2021 1507   CALCIUM 9.0 05/15/2021 1507   PROT 5.1 (L) 05/15/2021 1507   ALBUMIN 3.6 09/17/2020 0937   AST 12 05/15/2021 1507   ALT 10 05/15/2021 1507   ALKPHOS 50 09/17/2020 0937   BILITOT 0.3 05/15/2021 1507   GFRNONAA >60 09/17/2020 5537   Lab Results  Component Value Date   CHOL 204 (H) 08/15/2020   HDL 75.80 08/15/2020   LDLCALC 108 (H) 08/15/2020   TRIG 99.0 08/15/2020   No results found for:  HGBA1C No results found for: VITAMINB12 Lab Results  Component Value Date   TSH 3.92 10/15/2019     ASSESSMENT AND PLAN  61 y.o. year old female  with Addison's disease who is presenting after a single episode of seizure after donating larger than normal amount of plasma.  Patient seizure likely provoked by dehydration/volume depletion.  I advised her that since this is her first lifetime seizure, then it is reasonable to hold on the levetiracetam.  I will obtain a routine EEG and based on the result will obtain MRI brain.  I also advised against driving, since she likely had a seizure, she should not be driving for the next 62-month.  She understands and comfortable with plan.  I will contact her after the completion of the EEG, and if normal she can follow-up with her primary care doctor.  Return if worse.   1. Provoked seizure [facility])     Patient Instructions  Routine EEG  Discontinue Keppra  Discussed driving restriction  Follow up with your primary care doctor and return if worse   Per [facility] statutes, patients with seizures are not allowed to drive until they have been seizure-free for six months.  Other recommendations include using caution when using heavy equipment or power tools. Avoid working on ladders or at heights. Take showers instead of baths.  Do not swim alone.  Ensure the water temperature is not too high on the home water heater. Do not go swimming alone. Do not lock yourself in a room alone (i.e. bathroom). When caring for infants or small children, sit down when holding, feeding, or changing them to minimize risk of injury to the child.       Per [facility] statutes, patients with seizures are not allowed to drive until they have been seizure-free for six months.  Other recommendations include using caution when using heavy equipment or power tools. Avoid working on ladders or at heights. Take showers instead of baths.  Do not  swim alone.  Ensure  the water temperature is not too high on the home water heater. Do not go swimming alone. Do not lock yourself in a room alone (i.e. bathroom). When caring for infants or small children, sit down when holding, feeding, or changing them to minimize risk of injury to the child in the event you have a seizure. Maintain good sleep hygiene. Avoid alcohol.  Also recommend adequate sleep, hydration, good diet and minimize stress.   During the Seizure  - First, ensure adequate ventilation and place patients on the floor on their left side  Loosen clothing around the neck and ensure the airway is patent. If the patient is clenching the teeth, do not force the mouth open with any object as this can cause severe damage - Remove all items from the surrounding that can be hazardous. The patient may be oblivious to what's happening and may not even know what he or she is doing. If the patient is confused and wandering, either gently guide him/her away and block access to outside areas - Reassure the individual and be comforting - Call 911. In most cases, the seizure ends before EMS arrives. However, there are cases when seizures may last over 3 to 5 minutes. Or the individual may have developed breathing difficulties or severe injuries. If a pregnant patient or a person with diabetes develops a seizure, it is prudent to call an ambulance. - Finally, if the patient does not regain full consciousness, then call EMS. Most patients will remain confused for about 45 to 90 minutes after a seizure, so you must use judgment in calling for help. - Avoid restraints but make sure the patient is in a bed with padded side rails - Place the individual in a lateral position with the neck slightly flexed; this will help the saliva drain from the mouth and prevent the tongue from falling backward - Remove all nearby furniture and other hazards from the area - Provide verbal assurance as the individual is regaining consciousness -  Provide the patient with privacy if possible - Call for help and start treatment as ordered by the caregiver   After the Seizure (Postictal Stage)  After a seizure, most patients experience confusion, fatigue, muscle pain and/or a headache. Thus, one should permit the individual to sleep. For the next few days, reassurance is essential. Being calm and helping reorient the person is also of importance.  Most seizures are painless and end spontaneously. Seizures are not harmful to others but can lead to complications such as stress on the lungs, brain and the heart. Individuals with prior lung problems may develop labored breathing and respiratory distress.     Orders Placed This Encounter  Procedures   EEG adult    No orders of the defined types were placed in this encounter.   Return if symptoms worsen or fail to improve.    Windell Norfolk, MD 06/26/2021, 9:40 AM  [facility] [address] [place], [place] [address] [phone]

## 2021-06-30 ENCOUNTER — Ambulatory Visit (INDEPENDENT_AMBULATORY_CARE_PROVIDER_SITE_OTHER): Payer: Medicare Other | Admitting: Neurology

## 2021-06-30 DIAGNOSIS — R569 Unspecified convulsions: Secondary | ICD-10-CM

## 2021-07-01 NOTE — Procedures (Signed)
° ° °  History:  61 year old woman with one single seizure after donating plasma  EEG classification:  Awake and asleep  Description of the recording: The background rhythms of this recording consists of a fairly well modulated medium amplitude background activity of 10-12 Hz. As the record progresses, the patient initially is in the waking state, but appears to enter the early stage II sleep during the recording, with rudimentary sleep spindles and vertex sharp wave activity seen. During the wakeful state, photic stimulation is performed, and no abnormal responses were seen. Hyperventilation was also performed, no abnormal response seen. No epileptiform discharges seen during this recording. There was no focal slowing. EKG monitor shows no evidence of cardiac rhythm abnormalities with a heart rate of 60.  Impression: This is a normal EEG recording in the waking and sleeping state. No evidence interictal epileptiform discharges were seen at any time during the recording.  A normal EEG does not exclude a diagnosis of epilepsy.    Alric Ran, MD [facility]

## 2021-08-17 ENCOUNTER — Encounter: Payer: Medicare Other | Admitting: Family Medicine

## 2021-08-18 ENCOUNTER — Ambulatory Visit (INDEPENDENT_AMBULATORY_CARE_PROVIDER_SITE_OTHER): Payer: Medicare Other | Admitting: Family Medicine

## 2021-08-18 ENCOUNTER — Encounter: Payer: Self-pay | Admitting: Family Medicine

## 2021-08-18 VITALS — BP 108/68 | HR 86 | Temp 97.8°F | Ht 69.0 in | Wt 147.4 lb

## 2021-08-18 DIAGNOSIS — R739 Hyperglycemia, unspecified: Secondary | ICD-10-CM | POA: Diagnosis not present

## 2021-08-18 DIAGNOSIS — R300 Dysuria: Secondary | ICD-10-CM | POA: Diagnosis not present

## 2021-08-18 DIAGNOSIS — M545 Low back pain, unspecified: Secondary | ICD-10-CM

## 2021-08-18 DIAGNOSIS — E7800 Pure hypercholesterolemia, unspecified: Secondary | ICD-10-CM | POA: Diagnosis not present

## 2021-08-18 DIAGNOSIS — G8929 Other chronic pain: Secondary | ICD-10-CM | POA: Diagnosis not present

## 2021-08-18 LAB — POC URINALSYSI DIPSTICK (AUTOMATED)
Bilirubin, UA: NEGATIVE
Blood, UA: NEGATIVE
Glucose, UA: NEGATIVE
Ketones, UA: NEGATIVE
Leukocytes, UA: NEGATIVE
Nitrite, UA: POSITIVE
Protein, UA: NEGATIVE
Spec Grav, UA: 1.025 (ref 1.010–1.025)
Urobilinogen, UA: 0.2 E.U./dL
pH, UA: 6 (ref 5.0–8.0)

## 2021-08-18 MED ORDER — CELECOXIB 100 MG PO CAPS: 100.0000 mg | ORAL_CAPSULE | Freq: Two times a day (BID) | ORAL | 2 refills | Status: DC

## 2021-08-18 MED ORDER — TIZANIDINE HCL 4 MG PO TABS: 4.0000 mg | ORAL_TABLET | Freq: Four times a day (QID) | ORAL | 0 refills | Status: DC | PRN

## 2021-08-18 MED ORDER — NITROFURANTOIN MONOHYD MACRO 100 MG PO CAPS: 100.0000 mg | ORAL_CAPSULE | Freq: Two times a day (BID) | ORAL | 0 refills | Status: AC

## 2021-08-18 NOTE — Progress Notes (Signed)
Chief Complaint  Patient presents with   Follow-up    Urine odor   Dysuria    Brenda Curry is a 61 y.o. female here for possible UTI.  Duration: 1 day. Symptoms: Dysuria, urinary frequency, urinary retention, flank pain on left, and urgency Denies: hematuria, urinary hesitancy, fever, nausea, vomiting, vaginal discharge Hx of recurrent UTI? No Denies new sexual partners.  The remainder chronic low back pain Patient has had a flare of her chronic low back pain since having COVID most recently 2 months ago.  It is bilateral.  No loss of control of bowel/bladder function, bruising, redness, swelling, or weakness, numbness, tingling.  She was on Celebrex in the past that worked well and she is requesting a refill.  She historically does well with this.  She has been using Tylenol with minimal relief.  Past Medical History:  Diagnosis Date   Chicken pox    Frequent headaches    History of fainting spells of unknown cause    Migraines    Urinary tract infection      BP 108/68    Pulse 86    Temp 97.8 F (36.6 C) (Oral)    Ht 5\' 9"  (1.753 m)    Wt 147 lb 6 oz (66.8 kg)    LMP  (LMP Unknown)    SpO2 99%    BMI 21.76 kg/m  General: Awake, alert, appears stated age Heart: RRR Lungs: CTAB, normal respiratory effort, no accessory muscle usage Abd: BS+, soft, NT, ND, no masses or organomegaly MSK: No CVA tenderness, neg Lloyd's sign, + TTP over the lumbar paraspinal and erector spinae group musculature bilaterally, worse in the right, slightly poor hamstring range of motion bilaterally Neuro: DTRs equal and symmetric throughout, no clonus, no cerebellar signs, 5/5 strength throughout the lower extremities, negative straight leg bilaterally Psych: Age appropriate judgment and insight  Dysuria - Plan: POCT Urinalysis Dipstick (Automated), Urine Culture, nitrofurantoin, macrocrystal-monohydrate, (MACROBID) 100 MG capsule  Chronic bilateral low back pain without sciatica - Plan:  tiZANidine (ZANAFLEX) 4 MG tablet, celecoxib (CELEBREX) 100 MG capsule  Pure hypercholesterolemia - Plan: Comprehensive metabolic panel, Lipid panel  Hyperglycemia - Plan: Hemoglobin A1c  5 days of Macrobid twice daily.  Check culture.  Stay hydrated. Seek immediate care if pt starts to develop fevers, new/worsening symptoms, uncontrollable N/V. Chronic, unstable.  Restart Celebrex, trial of Zanaflex, stretches and exercises, heat, ice, Tylenol.  Physical therapy if no better in the next month. Check labs. Check labs. The patient voiced understanding and agreement to the plan.  [place], DO 08/18/21 3:45 PM

## 2021-08-18 NOTE — Patient Instructions (Addendum)
Give [place] 2-3 business days to get the results of your labs back.   Keep the diet clean and stay active.  Stay hydrated.   Warning signs/symptoms: Uncontrollable nausea/vomiting, fevers, worsening symptoms despite treatment, confusion.  Give [place] around 2 business days to get culture back to you.  Heat (pad or rice pillow in microwave) over affected area, 10-15 minutes twice daily.   Ice/cold pack over area for 10-15 min twice daily.  OK to take Tylenol 1000 mg (2 extra strength tabs) or 975 mg (3 regular strength tabs) every 6 hours as needed.  Send me a message in a month if back pain is still bothersome.   Let us know if you need anything.  EXERCISES  RANGE OF MOTION (ROM) AND STRETCHING EXERCISES - Low Back Pain Most people with lower back pain will find that their symptoms get worse with excessive bending forward (flexion) or arching at the lower back (extension). The exercises that will help resolve your symptoms will focus on the opposite motion.  If you have pain, numbness or tingling which travels down into your buttocks, leg or foot, the goal of the therapy is for these symptoms to move closer to your back and eventually resolve. Sometimes, these leg symptoms will get better, but your lower back pain may worsen. This is often an indication of progress in your rehabilitation. Be very alert to any changes in your symptoms and the activities in which you participated in the 24 hours prior to the change. Sharing this information with your caregiver will allow him or her to most efficiently treat your condition. These exercises may help you when beginning to rehabilitate your injury. Your symptoms may resolve with or without further involvement from your physician, physical therapist or athletic trainer. While completing these exercises, remember:  Restoring tissue flexibility helps normal motion to return to the joints. This allows healthier, less painful movement and activity. An  effective stretch should be held for at least 30 seconds. A stretch should never be painful. You should only feel a gentle lengthening or release in the stretched tissue. FLEXION RANGE OF MOTION AND STRETCHING EXERCISES:  STRETCH - Flexion, Single Knee to Chest  Lie on a firm bed or floor with both legs extended in front of you. Keeping one leg in contact with the floor, bring your opposite knee to your chest. Hold your leg in place by either grabbing behind your thigh or at your knee. Pull until you feel a gentle stretch in your low back. Hold 30 seconds. Slowly release your grasp and repeat the exercise with the opposite side. Repeat 2 times. Complete this exercise 3 times per week.   STRETCH - Flexion, Double Knee to Chest Lie on a firm bed or floor with both legs extended in front of you. Keeping one leg in contact with the floor, bring your opposite knee to your chest. Tense your stomach muscles to support your back and then lift your other knee to your chest. Hold your legs in place by either grabbing behind your thighs or at your knees. Pull both knees toward your chest until you feel a gentle stretch in your low back. Hold 30 seconds. Tense your stomach muscles and slowly return one leg at a time to the floor. Repeat 2 times. Complete this exercise 3 times per week.   STRETCH - Low Trunk Rotation Lie on a firm bed or floor. Keeping your legs in front of you, bend your knees so they are both pointed  toward the ceiling and your feet are flat on the floor. Extend your arms out to the side. This will stabilize your upper body by keeping your shoulders in contact with the floor. Gently and slowly drop both knees together to one side until you feel a gentle stretch in your low back. Hold for 30 seconds. Tense your stomach muscles to support your lower back as you bring your knees back to the starting position. Repeat the exercise to the other side. Repeat 2 times. Complete this exercise at  least 3 times per week.   EXTENSION RANGE OF MOTION AND FLEXIBILITY EXERCISES:  STRETCH - Extension, Prone on Elbows  Lie on your stomach on the floor, a bed will be too soft. Place your palms about shoulder width apart and at the height of your head. Place your elbows under your shoulders. If this is too painful, stack pillows under your chest. Allow your body to relax so that your hips drop lower and make contact more completely with the floor. Hold this position for 30 seconds. Slowly return to lying flat on the floor. Repeat 2 times. Complete this exercise 3 times per week.   RANGE OF MOTION - Extension, Prone Press Ups Lie on your stomach on the floor, a bed will be too soft. Place your palms about shoulder width apart and at the height of your head. Keeping your back as relaxed as possible, slowly straighten your elbows while keeping your hips on the floor. You may adjust the placement of your hands to maximize your comfort. As you gain motion, your hands will come more underneath your shoulders. Hold this position 30 seconds. Slowly return to lying flat on the floor. Repeat 2 times. Complete this exercise 3 times per week.   RANGE OF MOTION- Quadruped, Neutral Spine  Assume a hands and knees position on a firm surface. Keep your hands under your shoulders and your knees under your hips. You may place padding under your knees for comfort. Drop your head and point your tailbone toward the ground below you. This will round out your lower back like an angry cat. Hold this position for 30 seconds. Slowly lift your head and release your tail bone so that your back sags into a large arch, like an old horse. Hold this position for 30 seconds. Repeat this until you feel limber in your low back. Now, find your "sweet spot." This will be the most comfortable position somewhere between the two previous positions. This is your neutral spine. Once you have found this position, tense your stomach  muscles to support your low back. Hold this position for 30 seconds. Repeat 2 times. Complete this exercise 3 times per week.   STRENGTHENING EXERCISES - Low Back Sprain These exercises may help you when beginning to rehabilitate your injury. These exercises should be done near your "sweet spot." This is the neutral, low-back arch, somewhere between fully rounded and fully arched, that is your least painful position. When performed in this safe range of motion, these exercises can be used for people who have either a flexion or extension based injury. These exercises may resolve your symptoms with or without further involvement from your physician, physical therapist or athletic trainer. While completing these exercises, remember:  Muscles can gain both the endurance and the strength needed for everyday activities through controlled exercises. Complete these exercises as instructed by your physician, physical therapist or athletic trainer. Increase the resistance and repetitions only as guided. You may experience muscle  soreness or fatigue, but the pain or discomfort you are trying to eliminate should never worsen during these exercises. If this pain does worsen, stop and make certain you are following the directions exactly. If the pain is still present after adjustments, discontinue the exercise until you can discuss the trouble with your caregiver.  STRENGTHENING - Deep Abdominals, Pelvic Tilt  Lie on a firm bed or floor. Keeping your legs in front of you, bend your knees so they are both pointed toward the ceiling and your feet are flat on the floor. Tense your lower abdominal muscles to press your low back into the floor. This motion will rotate your pelvis so that your tail bone is scooping upwards rather than pointing at your feet or into the floor. With a gentle tension and even breathing, hold this position for 3 seconds. Repeat 2 times. Complete this exercise 3 times per week.    STRENGTHENING - Abdominals, Crunches  Lie on a firm bed or floor. Keeping your legs in front of you, bend your knees so they are both pointed toward the ceiling and your feet are flat on the floor. Cross your arms over your chest. Slightly tip your chin down without bending your neck. Tense your abdominals and slowly lift your trunk high enough to just clear your shoulder blades. Lifting higher can put excessive stress on the lower back and does not further strengthen your abdominal muscles. Control your return to the starting position. Repeat 2 times. Complete this exercise 3 times per week.   STRENGTHENING - Quadruped, Opposite UE/LE Lift  Assume a hands and knees position on a firm surface. Keep your hands under your shoulders and your knees under your hips. You may place padding under your knees for comfort. Find your neutral spine and gently tense your abdominal muscles so that you can maintain this position. Your shoulders and hips should form a rectangle that is parallel with the floor and is not twisted. Keeping your trunk steady, lift your right hand no higher than your shoulder and then your left leg no higher than your hip. Make sure you are not holding your breath. Hold this position for 30 seconds. Continuing to keep your abdominal muscles tense and your back steady, slowly return to your starting position. Repeat with the opposite arm and leg. Repeat 2 times. Complete this exercise 3 times per week.   STRENGTHENING - Abdominals and Quadriceps, Straight Leg Raise  Lie on a firm bed or floor with both legs extended in front of you. Keeping one leg in contact with the floor, bend the other knee so that your foot can rest flat on the floor. Find your neutral spine, and tense your abdominal muscles to maintain your spinal position throughout the exercise. Slowly lift your straight leg off the floor about 6 inches for a count of 3, making sure to not hold your breath. Still keeping  your neutral spine, slowly lower your leg all the way to the floor. Repeat this exercise with each leg 2 times. Complete this exercise 3 times per week.  POSTURE AND BODY MECHANICS CONSIDERATIONS - Low Back Sprain Keeping correct posture when sitting, standing or completing your activities will reduce the stress put on different body tissues, allowing injured tissues a chance to heal and limiting painful experiences. The following are general guidelines for improved posture.  While reading these guidelines, remember: The exercises prescribed by your provider will help you have the flexibility and strength to maintain correct postures. The  correct posture provides the best environment for your joints to work. All of your joints have less wear and tear when properly supported by a spine with good posture. This means you will experience a healthier, less painful body. Correct posture must be practiced with all of your activities, especially prolonged sitting and standing. Correct posture is as important when doing repetitive low-stress activities (typing) as it is when doing a single heavy-load activity (lifting).  RESTING POSITIONS Consider which positions are most painful for you when choosing a resting position. If you have pain with flexion-based activities (sitting, bending, stooping, squatting), choose a position that allows you to rest in a less flexed posture. You would want to avoid curling into a fetal position on your side. If your pain worsens with extension-based activities (prolonged standing, working overhead), avoid resting in an extended position such as sleeping on your stomach. Most people will find more comfort when they rest with their spine in a more neutral position, neither too rounded nor too arched. Lying on a non-sagging bed on your side with a pillow between your knees, or on your back with a pillow under your knees will often provide some relief. Keep in mind, being in any one  position for a prolonged period of time, no matter how correct your posture, can still lead to stiffness.  PROPER SITTING POSTURE In order to minimize stress and discomfort on your spine, you must sit with correct posture. Sitting with good posture should be effortless for a healthy body. Returning to good posture is a gradual process. Many people can work toward this most comfortably by using various supports until they have the flexibility and strength to maintain this posture on their own. When sitting with proper posture, your ears will fall over your shoulders and your shoulders will fall over your hips. You should use the back of the chair to support your upper back. Your lower back will be in a neutral position, just slightly arched. You may place a small pillow or folded towel at the base of your lower back for  support.  When working at a desk, create an environment that supports good, upright posture. Without extra support, muscles tire, which leads to excessive strain on joints and other tissues. Keep these recommendations in mind:  CHAIR: A chair should be able to slide under your desk when your back makes contact with the back of the chair. This allows you to work closely. The chair's height should allow your eyes to be level with the upper part of your monitor and your hands to be slightly lower than your elbows.  BODY POSITION Your feet should make contact with the floor. If this is not possible, use a foot rest. Keep your ears over your shoulders. This will reduce stress on your neck and low back.  INCORRECT SITTING POSTURES  If you are feeling tired and unable to assume a healthy sitting posture, do not slouch or slump. This puts excessive strain on your back tissues, causing more damage and pain. Healthier options include: Using more support, like a lumbar pillow. Switching tasks to something that requires you to be upright or walking. Talking a brief walk. Lying down to rest  in a neutral-spine position.  PROLONGED STANDING WHILE SLIGHTLY LEANING FORWARD  When completing a task that requires you to lean forward while standing in one place for a long time, place either foot up on a stationary 2-4 inch high object to help maintain the best posture. When  both feet are on the ground, the lower back tends to lose its slight inward curve. If this curve flattens (or becomes too large), then the back and your other joints will experience too much stress, tire more quickly, and can cause pain.  CORRECT STANDING POSTURES Proper standing posture should be assumed with all daily activities, even if they only take a few moments, like when brushing your teeth. As in sitting, your ears should fall over your shoulders and your shoulders should fall over your hips. You should keep a slight tension in your abdominal muscles to brace your spine. Your tailbone should point down to the ground, not behind your body, resulting in an over-extended swayback posture.   INCORRECT STANDING POSTURES  Common incorrect standing postures include a forward head, locked knees and/or an excessive swayback. WALKING Walk with an upright posture. Your ears, shoulders and hips should all line-up.  PROLONGED ACTIVITY IN A FLEXED POSITION When completing a task that requires you to bend forward at your waist or lean over a low surface, try to find a way to stabilize 3 out of 4 of your limbs. You can place a hand or elbow on your thigh or rest a knee on the surface you are reaching across. This will provide you more stability, so that your muscles do not tire as quickly. By keeping your knees relaxed, or slightly bent, you will also reduce stress across your lower back. CORRECT LIFTING TECHNIQUES  DO : Assume a wide stance. This will provide you more stability and the opportunity to get as close as possible to the object which you are lifting. Tense your abdominals to brace your spine. Bend at the knees and  hips. Keeping your back locked in a neutral-spine position, lift using your leg muscles. Lift with your legs, keeping your back straight. Test the weight of unknown objects before attempting to lift them. Try to keep your elbows locked down at your sides in order get the best strength from your shoulders when carrying an object.   Always ask for help when lifting heavy or awkward objects. INCORRECT LIFTING TECHNIQUES DO NOT:  Lock your knees when lifting, even if it is a small object. Bend and twist. Pivot at your feet or move your feet when needing to change directions. Assume that you can safely pick up even a paperclip without proper posture.

## 2021-08-19 LAB — COMPREHENSIVE METABOLIC PANEL
ALT: 12 U/L (ref 0–35)
AST: 14 U/L (ref 0–37)
Albumin: 4.4 g/dL (ref 3.5–5.2)
Alkaline Phosphatase: 72 U/L (ref 39–117)
BUN: 19 mg/dL (ref 6–23)
CO2: 32 mEq/L (ref 19–32)
Calcium: 9.7 mg/dL (ref 8.4–10.5)
Chloride: 103 mEq/L (ref 96–112)
Creatinine, Ser: 0.76 mg/dL (ref 0.40–1.20)
GFR: 85.35 mL/min (ref >60.00)
Glucose, Bld: 97 mg/dL (ref 70–99)
Potassium: 4.6 mEq/L (ref 3.5–5.1)
Sodium: 140 mEq/L (ref 135–145)
Total Bilirubin: 0.5 mg/dL (ref 0.2–1.2)
Total Protein: 6.5 g/dL (ref 6.0–8.3)

## 2021-08-19 LAB — LIPID PANEL
Cholesterol: 205 mg/dL — ABNORMAL HIGH (ref 0–200)
HDL: 72.5 mg/dL (ref >39.00)
LDL Cholesterol: 106 mg/dL — ABNORMAL HIGH (ref 0–99)
NonHDL: 132.82
Total CHOL/HDL Ratio: 3
Triglycerides: 136 mg/dL (ref 0.0–149.0)
VLDL: 27.2 mg/dL (ref 0.0–40.0)

## 2021-08-19 LAB — HEMOGLOBIN A1C: Hgb A1c MFr Bld: 5.4 % (ref 4.6–6.5)

## 2021-08-20 LAB — URINE CULTURE
MICRO NUMBER:: 13037017
SPECIMEN QUALITY:: ADEQUATE

## 2021-09-28 ENCOUNTER — Ambulatory Visit (INDEPENDENT_AMBULATORY_CARE_PROVIDER_SITE_OTHER): Payer: Medicare Other

## 2021-09-28 VITALS — Ht 69.0 in | Wt 147.0 lb

## 2021-09-28 DIAGNOSIS — Z0000 Encounter for general adult medical examination without abnormal findings: Secondary | ICD-10-CM

## 2021-09-28 NOTE — Progress Notes (Signed)
? ?Subjective:  ? Brenda Curry is a 61 y.o. female who presents for Medicare Annual (Subsequent) preventive examination. ? ?I connected with Danicia today by telephone and verified that I am speaking with the correct person using two identifiers. ?Location patient: home ?Location provider: work ?Persons participating in the virtual visit: patient, nurse.  ?  ?I discussed the limitations, risks, security and privacy concerns of performing an evaluation and management service by telephone and the availability of in person appointments. I also discussed with the patient that there may be a patient responsible charge related to this service. The patient expressed understanding and verbally consented to this telephonic visit.  ?  ?Interactive audio and video telecommunications were attempted between this provider and patient, however failed, due to patient having technical difficulties OR patient did not have access to video capability.  We continued and completed visit with audio only. ? ?Some vital signs may be absent or patient reported.  ? ?Time Spent with patient on telephone encounter: 20 minutes ? ? ?Review of Systems    ? ?Cardiac Risk Factors include: none ? ?   ?Objective:  ?  ?Today's Vitals  ? 09/28/21 1533  ?Weight: 147 lb (66.7 kg)  ?Height: 5\' 9"  (1.753 m)  ? ?Body mass index is 21.71 kg/m?. ? ? ?  09/28/2021  ?  3:36 PM 09/17/2020  ?  9:38 AM 07/05/2019  ?  9:07 AM 06/29/2018  ?  9:57 AM  ?Advanced Directives  ?Does Patient Have a Medical Advance Directive? No No No No  ?Would patient like information on creating a medical advance directive?  No - Patient declined No - Patient declined No - Patient declined  ? ? ?Current Medications (verified) ?Outpatient Encounter Medications as of 09/28/2021  ?Medication Sig  ? celecoxib (CELEBREX) 100 MG capsule Take 1 capsule (100 mg total) by mouth 2 (two) times daily.  ? fludrocortisone (FLORINEF) 0.1 MG tablet Take 1 tablet (0.1 mg total) by mouth daily.  ?  ibuprofen (ADVIL,MOTRIN) 200 MG tablet Take 200 mg by mouth every 6 (six) hours as needed for moderate pain.  ? midodrine (PROAMATINE) 5 MG tablet Take 1 tablet (5 mg total) by mouth 2 (two) times daily.  ? ondansetron (ZOFRAN-ODT) 4 MG disintegrating tablet Take 1 tablet (4 mg total) by mouth every 8 (eight) hours as needed for nausea or vomiting.  ? tiZANidine (ZANAFLEX) 4 MG tablet Take 1 tablet (4 mg total) by mouth every 6 (six) hours as needed for muscle spasms.  ? ?No facility-administered encounter medications on file as of 09/28/2021.  ? ? ?Allergies (verified) ?Bactrim [sulfamethoxazole-trimethoprim] and Tape  ? ?History: ?Past Medical History:  ?Diagnosis Date  ? Chicken pox   ? Frequent headaches   ? History of fainting spells of unknown cause   ? Migraines   ? Urinary tract infection   ? ?Past Surgical History:  ?Procedure Laterality Date  ? BACK SURGERY  2007  ? BACK SURGERY  2009  ? NECK SURGERY  2007  ? ?Family History  ?Problem Relation Age of Onset  ? Hearing loss Mother   ? Hyperlipidemia Mother   ? Aortic aneurysm Mother   ? Biliary Cirrhosis Mother   ? Cancer Father   ? Heart attack Father   ? Heart disease Father   ? High Cholesterol Father   ? Hypertension Father   ? Aortic aneurysm Father   ? Hashimoto's thyroiditis Sister   ? Heart disease Brother   ? Atrial fibrillation Brother   ?  Diabetes Maternal Grandmother   ? Alcohol abuse Paternal Grandfather   ? Cancer Paternal Grandfather   ? Heart disease Paternal Grandfather   ? Diabetes Brother   ? Hyperlipidemia Brother   ? Obesity Brother   ? Thyroid disease Cousin   ? ?Social History  ? ?Socioeconomic History  ? Marital status: Divorced  ?  Spouse name: Not on file  ? Number of children: Not on file  ? Years of education: Not on file  ? Highest education level: Not on file  ?Occupational History  ? Not on file  ?Tobacco Use  ? Smoking status: Former  ?  Years: 36.00  ?  Types: Cigarettes  ?  Quit date: 09/27/2018  ?  Years since quitting: 3.0  ?  Smokeless tobacco: Never  ?Vaping Use  ? Vaping Use: Former  ?Substance and Sexual Activity  ? Alcohol use: Yes  ?  Comment: rarely  ? Drug use: Never  ? Sexual activity: Not Currently  ?Other Topics Concern  ? Not on file  ?Social History Narrative  ? Not on file  ? ?Social Determinants of Health  ? ?Financial Resource Strain: Low Risk   ? Difficulty of Paying Living Expenses: Not hard at all  ?Food Insecurity: No Food Insecurity  ? Worried About Programme researcher, broadcasting/film/videounning Out of Food in the Last Year: Never true  ? Ran Out of Food in the Last Year: Never true  ?Transportation Needs: No Transportation Needs  ? Lack of Transportation (Medical): No  ? Lack of Transportation (Non-Medical): No  ?Physical Activity: Insufficiently Active  ? Days of Exercise per Week: 3 days  ? Minutes of Exercise per Session: 20 min  ?Stress: No Stress Concern Present  ? Feeling of Stress : Not at all  ?Social Connections: Moderately Isolated  ? Frequency of Communication with Friends and Family: More than three times a week  ? Frequency of Social Gatherings with Friends and Family: Three times a week  ? Attends Religious Services: 1 to 4 times per year  ? Active Member of Clubs or Organizations: No  ? Attends BankerClub or Organization Meetings: Never  ? Marital Status: Divorced  ? ? ?Tobacco Counseling ?Counseling given: Not Answered ? ? ?Clinical Intake: ? ?Pre-visit preparation completed: Yes ? ?Pain : No/denies pain ? ?  ? ?BMI - recorded: 21.71 ?Nutritional Status: BMI of 19-24  Normal ?Nutritional Risks: None ?Diabetes: No ? ?How often do you need to have someone help you when you read instructions, pamphlets, or other written materials from your doctor or pharmacy?: 1 - Never ? ?Diabetic?No ? ?Interpreter Needed?: No ? ?Information entered by :: Thomasenia SalesMartha Izacc Demeyer LPN ? ? ?Activities of Daily Living ? ?  09/28/2021  ?  3:38 PM  ?In your present state of health, do you have any difficulty performing the following activities:  ?Hearing? 0  ?Vision? 0   ?Difficulty concentrating or making decisions? 0  ?Walking or climbing stairs? 0  ?Dressing or bathing? 0  ?Doing errands, shopping? 0  ?Preparing Food and eating ? N  ?Using the Toilet? N  ?In the past six months, have you accidently leaked urine? N  ?Do you have problems with loss of bowel control? N  ?Managing your Medications? N  ?Managing your Finances? N  ?Housekeeping or managing your Housekeeping? N  ? ? ?Patient Care Team: ?Sharlene DoryWendling, Nicholas Paul, DO as PCP - General (Family Medicine) ? ?Indicate any recent Medical Services you may have received from other than [facility] providers in the  past year (date may be approximate). ? ?   ?Assessment:  ? This is a routine wellness examination for [facility] ? ?Hearing/Vision screen ?Hearing Screening - Comments:: No issues ?Vision Screening - Comments:: Last eye exam-2 years ago ? ?Dietary issues and exercise activities discussed: ?Current Exercise Habits: Home exercise routine, Type of exercise: walking, Time (Minutes): 20, Frequency (Times/Week): 3, Weekly Exercise (Minutes/Week): 60, Intensity: Mild, Exercise limited by: None identified ? ? Goals Addressed   ? ?  ?  ?  ?  ? This Visit's Progress  ?  Increase physical activity   On track  ? ?  ? ?Depression Screen ? ?  09/28/2021  ?  3:38 PM 11/18/2020  ?  3:29 PM 08/15/2020  ?  9:45 AM 07/05/2019  ?  9:11 AM 06/29/2018  ?  9:58 AM 05/05/2018  ?  1:27 PM  ?PHQ 2/9 Scores  ?PHQ - 2 Score 0 0 0 1 4 4   ?PHQ- 9 Score     11 14  ?  ?Fall Risk ? ?  09/28/2021  ?  3:37 PM 11/18/2020  ?  3:29 PM 07/05/2019  ?  9:11 AM 06/29/2018  ?  9:58 AM 05/05/2018  ?  1:15 PM  ?Fall Risk   ?Falls in the past year? 0 0 0 0 0  ?Number falls in past yr: 0 0     ?Injury with Fall? 0 0     ?Risk for fall due to :  History of fall(s)     ?Follow up Falls prevention discussed Falls evaluation completed Education provided;Falls prevention discussed    ? ? ?FALL RISK PREVENTION PERTAINING TO THE HOME: ? ?Any stairs in or around the home? Yes  ?If so, are there any  without handrails? No  ?Home free of loose throw rugs in walkways, pet beds, electrical cords, etc? Yes  ?Adequate lighting in your home to reduce risk of falls? Yes  ? ?ASSISTIVE DEVICES UTILIZED TO PREVENT FALLS:

## 2021-09-28 NOTE — Patient Instructions (Signed)
Ms. Quirindongo , ?Thank you for taking time to complete your Medicare Wellness Visit. I appreciate your ongoing commitment to your health goals. Please review the following plan we discussed and let me know if I can assist you in the future.  ? ?Screening recommendations/referrals: ?Colonoscopy: Completed 07/30/2015-Due 07/29/2025 ?Mammogram: Due-Per our conversation, you will call & schedule. ?Bone Density: Due at age 60 ?Recommended yearly ophthalmology/optometry visit for glaucoma screening and checkup ?Recommended yearly dental visit for hygiene and checkup ? ?Vaccinations: ?Influenza vaccine: Up to date ?Pneumococcal vaccine: Up to date ?Tdap vaccine: Up to date ?Shingles vaccine: Due-May obtain vaccine at your local pharmacy. ?Covid-19:Declined ? ?Advanced directives: May pick up forms at your next office visit. ? ?Conditions/risks identified: See problem list ? ?Next appointment: Follow up in one year for your annual wellness visit  ? ? ?Preventive Care 16 Years and Older, Female ?Preventive care refers to lifestyle choices and visits with your health care provider that can promote health and wellness. ?What does preventive care include? ?A yearly physical exam. This is also called an annual well check. ?Dental exams once or twice a year. ?Routine eye exams. Ask your health care provider how often you should have your eyes checked. ?Personal lifestyle choices, including: ?Daily care of your teeth and gums. ?Regular physical activity. ?Eating a healthy diet. ?Avoiding tobacco and drug use. ?Limiting alcohol use. ?Practicing safe sex. ?Taking low-dose aspirin every day. ?Taking vitamin and mineral supplements as recommended by your health care provider. ?What happens during an annual well check? ?The services and screenings done by your health care provider during your annual well check will depend on your age, overall health, lifestyle risk factors, and family history of disease. ?Counseling  ?Your health care  provider may ask you questions about your: ?Alcohol use. ?Tobacco use. ?Drug use. ?Emotional well-being. ?Home and relationship well-being. ?Sexual activity. ?Eating habits. ?History of falls. ?Memory and ability to understand (cognition). ?Work and work Statistician. ?Reproductive health. ?Screening  ?You may have the following tests or measurements: ?Height, weight, and BMI. ?Blood pressure. ?Lipid and cholesterol levels. These may be checked every 5 years, or more frequently if you are over 87 years old. ?Skin check. ?Lung cancer screening. You may have this screening every year starting at age 74 if you have a 30-pack-year history of smoking and currently smoke or have quit within the past 15 years. ?Fecal occult blood test (FOBT) of the stool. You may have this test every year starting at age 40. ?Flexible sigmoidoscopy or colonoscopy. You may have a sigmoidoscopy every 5 years or a colonoscopy every 10 years starting at age 50. ?Hepatitis C blood test. ?Hepatitis B blood test. ?Sexually transmitted disease (STD) testing. ?Diabetes screening. This is done by checking your blood sugar (glucose) after you have not eaten for a while (fasting). You may have this done every 1-3 years. ?Bone density scan. This is done to screen for osteoporosis. You may have this done starting at age 40. ?Mammogram. This may be done every 1-2 years. Talk to your health care provider about how often you should have regular mammograms. ?Talk with your health care provider about your test results, treatment options, and if necessary, the need for more tests. ?Vaccines  ?Your health care provider may recommend certain vaccines, such as: ?Influenza vaccine. This is recommended every year. ?Tetanus, diphtheria, and acellular pertussis (Tdap, Td) vaccine. You may need a Td booster every 10 years. ?Zoster vaccine. You may need this after age 3. ?Pneumococcal 13-valent conjugate (PCV13)  vaccine. One dose is recommended after age  33. ?Pneumococcal polysaccharide (PPSV23) vaccine. One dose is recommended after age 56. ?Talk to your health care provider about which screenings and vaccines you need and how often you need them. ?This information is not intended to replace advice given to you by your health care provider. Make sure you discuss any questions you have with your health care provider. ?Document Released: 07/11/2015 Document Revised: 03/03/2016 Document Reviewed: 04/15/2015 ?Elsevier Interactive Patient Education ? 2017 [employer]. ? ?Fall Prevention in the Home ?Falls can cause injuries. They can happen to people of all ages. There are many things you can do to make your home safe and to help prevent falls. ?What can I do on the outside of my home? ?Regularly fix the edges of walkways and driveways and fix any cracks. ?Remove anything that might make you trip as you walk through a door, such as a raised step or threshold. ?Trim any bushes or trees on the path to your home. ?Use bright outdoor lighting. ?Clear any walking paths of anything that might make someone trip, such as rocks or tools. ?Regularly check to see if handrails are loose or broken. Make sure that both sides of any steps have handrails. ?Any raised decks and porches should have guardrails on the edges. ?Have any leaves, snow, or ice cleared regularly. ?Use sand or salt on walking paths during winter. ?Clean up any spills in your garage right away. This includes oil or grease spills. ?What can I do in the bathroom? ?Use night lights. ?Install grab bars by the toilet and in the tub and shower. Do not use towel bars as grab bars. ?Use non-skid mats or decals in the tub or shower. ?If you need to sit down in the shower, use a plastic, non-slip stool. ?Keep the floor dry. Clean up any water that spills on the floor as soon as it happens. ?Remove soap buildup in the tub or shower regularly. ?Attach bath mats securely with double-sided non-slip rug tape. ?Do not have throw  rugs and other things on the floor that can make you trip. ?What can I do in the bedroom? ?Use night lights. ?Make sure that you have a light by your bed that is easy to reach. ?Do not use any sheets or blankets that are too big for your bed. They should not hang down onto the floor. ?Have a firm chair that has side arms. You can use this for support while you get dressed. ?Do not have throw rugs and other things on the floor that can make you trip. ?What can I do in the kitchen? ?Clean up any spills right away. ?Avoid walking on wet floors. ?Keep items that you use a lot in easy-to-reach places. ?If you need to reach something above you, use a strong step stool that has a grab bar. ?Keep electrical cords out of the way. ?Do not use floor polish or wax that makes floors slippery. If you must use wax, use non-skid floor wax. ?Do not have throw rugs and other things on the floor that can make you trip. ?What can I do with my stairs? ?Do not leave any items on the stairs. ?Make sure that there are handrails on both sides of the stairs and use them. Fix handrails that are broken or loose. Make sure that handrails are as long as the stairways. ?Check any carpeting to make sure that it is firmly attached to the stairs. Fix any carpet that is loose  or worn. ?Avoid having throw rugs at the top or bottom of the stairs. If you do have throw rugs, attach them to the floor with carpet tape. ?Make sure that you have a light switch at the top of the stairs and the bottom of the stairs. If you do not have them, ask someone to add them for you. ?What else can I do to help prevent falls? ?Wear shoes that: ?Do not have high heels. ?Have rubber bottoms. ?Are comfortable and fit you well. ?Are closed at the toe. Do not wear sandals. ?If you use a stepladder: ?Make sure that it is fully opened. Do not climb a closed stepladder. ?Make sure that both sides of the stepladder are locked into place. ?Ask someone to hold it for you, if  possible. ?Clearly mark and make sure that you can see: ?Any grab bars or handrails. ?First and last steps. ?Where the edge of each step is. ?Use tools that help you move around (mobility aids) if they are needed. These incl

## 2022-04-20 ENCOUNTER — Ambulatory Visit (INDEPENDENT_AMBULATORY_CARE_PROVIDER_SITE_OTHER): Payer: Medicare Other | Admitting: Family Medicine

## 2022-04-20 ENCOUNTER — Encounter: Payer: Self-pay | Admitting: Family Medicine

## 2022-04-20 VITALS — BP 118/70 | HR 65 | Temp 97.5°F | Ht 69.0 in | Wt 147.0 lb

## 2022-04-20 DIAGNOSIS — N3000 Acute cystitis without hematuria: Secondary | ICD-10-CM | POA: Diagnosis not present

## 2022-04-20 LAB — POC URINALSYSI DIPSTICK (AUTOMATED)
Bilirubin, UA: NEGATIVE
Blood, UA: NEGATIVE
Glucose, UA: NEGATIVE
Leukocytes, UA: NEGATIVE
Nitrite, UA: NEGATIVE
Protein, UA: POSITIVE — AB
Spec Grav, UA: >=1.03 — AB (ref 1.010–1.025)
Urobilinogen, UA: 0.2 E.U./dL
pH, UA: 6 (ref 5.0–8.0)

## 2022-04-20 MED ORDER — NITROFURANTOIN MONOHYD MACRO 100 MG PO CAPS: 100.0000 mg | ORAL_CAPSULE | Freq: Two times a day (BID) | ORAL | 0 refills | Status: AC

## 2022-04-20 NOTE — Patient Instructions (Addendum)
Stay hydrated.   Warning signs/symptoms: Uncontrollable nausea/vomiting, fevers, worsening symptoms despite treatment, confusion.  Give us around 2 business days to get culture back to you.  Let us know if you need anything. 

## 2022-04-20 NOTE — Progress Notes (Signed)
Chief Complaint  Patient presents with   Urinary Frequency   Flank Pain   Back Pain    Brenda Curry is a 61 y.o. female here for possible UTI.  Duration: 11 days. Symptoms: Dysuria, urinary frequency, hematuria, flank pain on bilateral, and urgency Denies: hematuria, urinary hesitancy, urinary retention, fever, nausea, and vomiting, vaginal discharge Hx of recurrent UTI? No Denies new sexual partners.  Past Medical History:  Diagnosis Date   Chicken pox    Frequent headaches    History of fainting spells of unknown cause    Migraines    Urinary tract infection      BP 118/70 (BP Location: Left Arm, Patient Position: Sitting, Cuff Size: Normal)   Pulse 65   Temp (!) 97.5 F (36.4 C) (Oral)   Ht 5\' 9"  (1.753 m)   Wt 147 lb (66.7 kg)   LMP  (LMP Unknown)   SpO2 98%   BMI 21.71 kg/m  General: Awake, alert, appears stated age Heart: RRR Lungs: CTAB, normal respiratory effort, no accessory muscle usage Abd: BS+, soft, NT, ND, no masses or organomegaly MSK: +ttp over thor/lumbar parasp msc b/l and b/l CVA, neg Lloyd's Psych: Age appropriate judgment and insight  Acute cystitis without hematuria - Plan: nitrofurantoin, macrocrystal-monohydrate, (MACROBID) 100 MG capsule  Stay hydrated. UA unremarkable, will cx. Seek immediate care if pt starts to develop fevers, new/worsening symptoms, uncontrollable N/V. F/u prn. The patient voiced understanding and agreement to the plan.  [place], DO 04/20/22 12:56 PM

## 2022-04-22 ENCOUNTER — Encounter: Payer: Self-pay | Admitting: Family Medicine

## 2022-04-22 LAB — URINE CULTURE
MICRO NUMBER:: 14093089
SPECIMEN QUALITY:: ADEQUATE

## 2022-04-23 ENCOUNTER — Encounter: Payer: Self-pay | Admitting: Family Medicine

## 2022-04-23 ENCOUNTER — Other Ambulatory Visit: Payer: Self-pay | Admitting: Family Medicine

## 2022-04-23 DIAGNOSIS — R569 Unspecified convulsions: Secondary | ICD-10-CM

## 2022-04-23 MED ORDER — ONDANSETRON 4 MG PO TBDP: 4.0000 mg | ORAL_TABLET | Freq: Three times a day (TID) | ORAL | 0 refills | Status: DC | PRN

## 2022-04-23 MED ORDER — CEFDINIR 300 MG PO CAPS: 300.0000 mg | ORAL_CAPSULE | Freq: Two times a day (BID) | ORAL | 0 refills | Status: AC

## 2022-05-12 NOTE — Progress Notes (Signed)
Cardiology Office Note:    Date:  05/13/2022   ID:  Brenda Curry, DOB 08/23/1960, MRN [id number]  PCP:  Sharlene Dory, DO  Cardiologist:  Norman Herrlich, MD    Referring MD: Sharlene Dory*    ASSESSMENT:    1. Orthostatic hypotension   2. Syncope and collapse    PLAN:    In order of problems listed above:  She is markedly improved since we initiated Florinef and alpha adrenergic agent to support her blood pressure I like her to trend her home blood pressure in general run 5-10 less than office and I am pleased with her standing blood pressure today in her case I think it is a combination of both chronic disc disease and surgery to her back as well as "post-COVID symptoms.  This can cause autonomic instability.   Next appointment: 1 year   Medication Adjustments/Labs and Tests Ordered: Current medicines are reviewed at length with the patient today.  Concerns regarding medicines are outlined above.  No orders of the defined types were placed in this encounter.  No orders of the defined types were placed in this encounter.   Chief Complaint  Patient presents with   Annual Exam   Follow-up   Hypotension    History of Present Illness:    Brenda Curry is a 61 y.o. female with a hx of orthostatic hypotension and syncope requiring alpha agonist support last seen 04/09/2021.Echocardiogram 08/06/2019: Normal  1. Left ventricular ejection fraction, by estimation, is 60 to 65%. The  left ventricle has normal function. Left ventricular diastolic parameters  are consistent with Grade I diastolic dysfunction (impaired relaxation).   2. Right ventricular systolic function is normal. There is normal  pulmonary artery systolic pressure.   3. The aortic valve is tricuspid.    Extended ambulatory heart rhythm monitor: Unremarkable there were few episodes of brief runs of atrial premature contractions. Study Highlights A ZIO monitor was performed  for 14 days beginning 07/19/2019 to assess palpitation. The heart rhythm throughout was sinus with minimum, average and maximum heart rates of 46, 70 and 150 bpm. There were no pauses of 3 seconds or greater and no episodes of AV nodal block or sinus node exit block. Ventricular ectopy was rare including 2 couplets and one triplet. Supraventricular ectopy was rare without episodes of atrial fibrillation or flutter.  There were 4 brief runs of APCs the longest 6 complexes at 97 bpm ectopic atrial rhythm and the fastest for complexes a rate of 158.  There was a brief episode of disorganized atrial rhythm 6 complexes right bundle branch block morphology that was incorrectly labeled as ventricular tachycardia.   There were 4 triggered and 3 diary events all associated with ectopy except for one sinus rhythm 1 was the presence of single PVC the others were supraventricular ectopy with up to 3 APCs in a row    There was concern of bradycardia from apple watch showing heart rate of 32 and she underwent a repeat extended monitor reported 11/28/2020 showed no bradycardia and no episodes of heart block or sinus node exit block ventricular and supraventricular arrhythmia was rare and triggered events were associated with PVCs Compliance with diet, lifestyle and medications: Yes  She is markedly improved but still intermittently has spells of lightheadedness if she abruptly shifts posture but has not had syncope.  I have asked her to purchase a validated Omron upper extremity blood pressure cuff and trend her blood pressures at home sitting  and standing and bring to office visits in the future.  She has had no side effects from her Florinef or midodrine. Past Medical History:  Diagnosis Date   Chicken pox    Frequent headaches    History of fainting spells of unknown cause    Migraines    Urinary tract infection     Past Surgical History:  Procedure Laterality Date   BACK SURGERY  2007   BACK SURGERY   2009   NECK SURGERY  2007    Current Medications: Current Meds  Medication Sig   celecoxib (CELEBREX) 100 MG capsule Take 1 capsule (100 mg total) by mouth 2 (two) times daily.   fludrocortisone (FLORINEF) 0.1 MG tablet Take 1 tablet (0.1 mg total) by mouth daily.   ibuprofen (ADVIL,MOTRIN) 200 MG tablet Take 200 mg by mouth every 6 (six) hours as needed for moderate pain.   midodrine (PROAMATINE) 5 MG tablet Take 1 tablet (5 mg total) by mouth 2 (two) times daily.   ondansetron (ZOFRAN-ODT) 4 MG disintegrating tablet Take 1 tablet (4 mg total) by mouth every 8 (eight) hours as needed for nausea or vomiting.   tiZANidine (ZANAFLEX) 4 MG tablet Take 1 tablet (4 mg total) by mouth every 6 (six) hours as needed for muscle spasms.     Allergies:   Bactrim [sulfamethoxazole-trimethoprim] and Tape   Social History   Socioeconomic History   Marital status: Divorced    Spouse name: Not on file   Number of children: Not on file   Years of education: Not on file   Highest education level: Not on file  Occupational History   Not on file  Tobacco Use   Smoking status: Former    Years: 36.00    Types: Cigarettes    Quit date: 09/27/2018    Years since quitting: 3.6   Smokeless tobacco: Never  Vaping Use   Vaping Use: Former  Substance and Sexual Activity   Alcohol use: Yes    Comment: rarely   Drug use: Never   Sexual activity: Not Currently  Other Topics Concern   Not on file  Social History Narrative   Not on file   Social Determinants of Health   Financial Resource Strain: Low Risk  (09/28/2021)   Overall Financial Resource Strain (CARDIA)    Difficulty of Paying Living Expenses: Not hard at all  Food Insecurity: No Food Insecurity (09/28/2021)   Hunger Vital Sign    Worried About Running Out of Food in the Last Year: Never true    Ran Out of Food in the Last Year: Never true  Transportation Needs: No Transportation Needs (09/28/2021)   PRAPARE - Scientist, research (physical sciences) (Medical): No    Lack of Transportation (Non-Medical): No  Physical Activity: Insufficiently Active (09/28/2021)   Exercise Vital Sign    Days of Exercise per Week: 3 days    Minutes of Exercise per Session: 20 min  Stress: No Stress Concern Present (09/28/2021)   [employer] of Occupational Health - Occupational Stress Questionnaire    Feeling of Stress : Not at all  Social Connections: Moderately Isolated (09/28/2021)   Social Connection and Isolation Panel [NHANES]    Frequency of Communication with Friends and Family: More than three times a week    Frequency of Social Gatherings with Friends and Family: Three times a week    Attends Religious Services: 1 to 4 times per year    Active Member of Clubs  or Organizations: No    Attends Banker Meetings: Never    Marital Status: Divorced     Family History: The patient's family history includes Alcohol abuse in her paternal grandfather; Aortic aneurysm in her father and mother; Atrial fibrillation in her brother; Biliary Cirrhosis in her mother; Cancer in her father and paternal grandfather; Diabetes in her brother and maternal grandmother; Hashimoto's thyroiditis in her sister; Hearing loss in her mother; Heart attack in her father; Heart disease in her brother, father, and paternal grandfather; High Cholesterol in her father; Hyperlipidemia in her brother and mother; Hypertension in her father; Obesity in her brother; Thyroid disease in her cousin. ROS:   Please see the history of present illness.    All other systems reviewed and are negative.  EKGs/Labs/Other Studies Reviewed:    The following studies were reviewed today:  EKG:  EKG ordered today and personally reviewed.  The ekg ordered today demonstrates sinus rhythm and is normal  Recent Labs: 05/15/2021: Hemoglobin 12.8; Platelets 274 08/18/2021: ALT 12; BUN 19; Creatinine, Ser 0.76; Potassium 4.6; Sodium 140  Recent Lipid Panel    Component Value  Date/Time   CHOL 205 (H) 08/18/2021 1542   TRIG 136.0 08/18/2021 1542   HDL 72.50 08/18/2021 1542   CHOLHDL 3 08/18/2021 1542   VLDL 27.2 08/18/2021 1542   LDLCALC 106 (H) 08/18/2021 1542    Physical Exam:    VS:  BP (!) 134/100 (BP Location: Right Arm, Patient Position: Sitting, Cuff Size: Normal)   Pulse 80   Ht 5\' 9"  (1.753 m)   Wt 143 lb (64.9 kg)   LMP  (LMP Unknown)   SpO2 97%   BMI 21.12 kg/m     Wt Readings from Last 3 Encounters:  05/13/22 143 lb (64.9 kg)  04/20/22 147 lb (66.7 kg)  09/28/21 147 lb (66.7 kg)     GEN:  Well nourished, well developed in no acute distress HEENT: Normal NECK: No JVD; No carotid bruits LYMPHATICS: No lymphadenopathy CARDIAC: RRR, no murmurs, rubs, gallops RESPIRATORY:  Clear to auscultation without rales, wheezing or rhonchi  ABDOMEN: Soft, non-tender, non-distended MUSCULOSKELETAL:  No edema; No deformity  SKIN: Warm and dry NEUROLOGIC:  Alert and oriented x 3 PSYCHIATRIC:  Normal affect    Signed, Norman Herrlich, MD  05/13/2022 2:08 PM    [facility]

## 2022-05-13 ENCOUNTER — Encounter: Payer: Self-pay | Admitting: Cardiology

## 2022-05-13 ENCOUNTER — Ambulatory Visit: Payer: Medicare Other | Attending: Cardiology | Admitting: Cardiology

## 2022-05-13 VITALS — BP 134/100 | HR 80 | Ht 69.0 in | Wt 143.0 lb

## 2022-05-13 DIAGNOSIS — I951 Orthostatic hypotension: Secondary | ICD-10-CM

## 2022-05-13 DIAGNOSIS — R55 Syncope and collapse: Secondary | ICD-10-CM | POA: Diagnosis not present

## 2022-05-13 NOTE — Patient Instructions (Signed)
Medication Instructions:  Your physician recommends that you continue on your current medications as directed. Please refer to the Current Medication list given to you today.  *If you need a refill on your cardiac medications before your next appointment, please call your pharmacy*   Lab Work: None If you have labs (blood work) drawn today and your tests are completely normal, you will receive your results only by: MyChart Message (if you have MyChart) OR A paper copy in the mail If you have any lab test that is abnormal or we need to change your treatment, we will call you to review the results.   Testing/Procedures: None   Follow-Up: At [facility], you and your health needs are our priority.  As part of our continuing mission to provide you with exceptional heart care, we have created designated Provider Care Teams.  These Care Teams include your primary Cardiologist (physician) and Advanced Practice Providers (APPs -  Physician Assistants and Nurse Practitioners) who all work together to provide you with the care you need, when you need it.  We recommend signing up for the patient portal called "MyChart".  Sign up information is provided on this After Visit Summary.  MyChart is used to connect with patients for Virtual Visits (Telemedicine).  Patients are able to view lab/test results, encounter notes, upcoming appointments, etc.  Non-urgent messages can be sent to your provider as well.   To learn more about what you can do with MyChart, go to [email or web address].    Your next appointment:   1 year(s)  The format for your next appointment:   In Person  Provider:   Norman Herrlich, MD    Other Instructions Get an Omron blood pressure device  Important Information About Sugar

## 2022-05-21 ENCOUNTER — Encounter: Payer: Self-pay | Admitting: Family Medicine

## 2022-05-24 ENCOUNTER — Other Ambulatory Visit: Payer: Self-pay

## 2022-05-24 DIAGNOSIS — N3000 Acute cystitis without hematuria: Secondary | ICD-10-CM

## 2022-05-25 ENCOUNTER — Other Ambulatory Visit (INDEPENDENT_AMBULATORY_CARE_PROVIDER_SITE_OTHER): Payer: Medicare Other

## 2022-05-25 DIAGNOSIS — N3000 Acute cystitis without hematuria: Secondary | ICD-10-CM

## 2022-05-25 LAB — POC URINALSYSI DIPSTICK (AUTOMATED)
Blood, UA: NEGATIVE
Glucose, UA: NEGATIVE
Ketones, UA: NEGATIVE
Nitrite, UA: NEGATIVE
Protein, UA: NEGATIVE
Spec Grav, UA: 1.025 (ref 1.010–1.025)
Urobilinogen, UA: 0.2 E.U./dL
pH, UA: 6 (ref 5.0–8.0)

## 2022-05-27 LAB — URINE CULTURE
MICRO NUMBER:: 14239587
SPECIMEN QUALITY:: ADEQUATE

## 2022-05-28 ENCOUNTER — Encounter: Payer: Self-pay | Admitting: Family Medicine

## 2022-05-28 ENCOUNTER — Ambulatory Visit (HOSPITAL_BASED_OUTPATIENT_CLINIC_OR_DEPARTMENT_OTHER)
Admission: RE | Admit: 2022-05-28 | Discharge: 2022-05-28 | Disposition: A | Discharge status: Home / Self Care | Payer: Medicare Other | Source: Ambulatory Visit | Attending: Family Medicine | Admitting: Family Medicine

## 2022-05-28 ENCOUNTER — Ambulatory Visit (INDEPENDENT_AMBULATORY_CARE_PROVIDER_SITE_OTHER): Payer: Medicare Other | Admitting: Family Medicine

## 2022-05-28 VITALS — BP 132/80 | HR 60 | Temp 97.8°F | Ht 69.0 in | Wt 143.4 lb

## 2022-05-28 DIAGNOSIS — R109 Unspecified abdominal pain: Secondary | ICD-10-CM | POA: Diagnosis not present

## 2022-05-28 MED ORDER — PROMETHAZINE HCL 25 MG PO TABS: 25.0000 mg | ORAL_TABLET | Freq: Three times a day (TID) | ORAL | 0 refills | Status: DC | PRN

## 2022-05-28 NOTE — Patient Instructions (Signed)
We will be in touch regarding your X-ray results as that will dictate our next steps.  Heat (pad or rice pillow in microwave) over affected area, 10-15 minutes twice daily.   Ice/cold pack over area for 10-15 min twice daily.  OK to take Tylenol 1000 mg (2 extra strength tabs) or 975 mg (3 regular strength tabs) every 6 hours as needed.  Let us know if you need anything.  Mid-Back Strain Rehab It is normal to feel mild stretching, pulling, tightness, or discomfort as you do these exercises, but you should stop right away if you feel sudden pain or your pain gets worse.  Stretching and range of motion exercises This exercise warms up your muscles and joints and improves the movement and flexibility of your back and shoulders. This exercise also help to relieve pain. Exercise A: Chest and spine stretch  Lie down on your back on a firm surface. Roll a towel or a small blanket so it is about 4 inches (10 cm) in diameter. Put the towel lengthwise under the middle of your back so it is under your spine, but not under your shoulder blades. To increase the stretch, you may put your hands behind your head and let your elbows fall to your sides. Hold for 30 seconds. Repeat exercise 2 times. Complete this exercise 3 times per week. Strengthening exercises These exercises build strength and endurance in your back and your shoulder blade muscles. Endurance is the ability to use your muscles for a long time, even after they get tired. Exercise C: Straight arm rows (shoulder extension) Stand with your feet shoulder width apart. Secure an exercise band to a stable object in front of you so the band is at or above shoulder height. Hold one end of the exercise band in each hand. Straighten your elbows and lift your hands up to shoulder height. Step back, away from the secured end of the exercise band, until the band stretches. Squeeze your shoulder blades together and pull your hands down to the sides of  your thighs. Stop when your hands are straight down by your sides. Do not let your hands go behind your body. Hold for 2 seconds. Slowly return to the starting position. Repeat 2 times. Complete this exercise 3 times per week. Exercise D: Shoulder external rotation, prone Lie on your abdomen on a firm bed so your left / right forearm hangs over the edge of the bed and your upper arm is on the bed, straight out from your body. Your elbow should be bent. Your palm should be facing your feet. If instructed, hold a 2-5 lb weight in your hand. Squeeze your shoulder blade toward the middle of your back. Do not let your shoulder lift toward your ear. Keep your elbow bent in an "L" shape (90 degrees) while you slowly move your forearm up toward the ceiling. Move your forearm up to the height of the bed, toward your head. Your upper arm should not move. At the top of the movement, your palm should face the floor. Hold for 1 second. Slowly return to the starting position and relax your muscles. Repeat 2 times. Complete this exercise 3 times per week. Exercise E: Scapular retraction and external rotation, rowing  Sit in a stable chair without armrests, or stand. Secure an exercise band to a stable object in front of you so it is at shoulder height. Hold one end of the exercise band in each hand. Bring your arms out straight in  front of you. Step back, away from the secured end of the exercise band, until the band stretches. Pull the band backward. As you do this, bend your elbows and squeeze your shoulder blades together, but avoid letting the rest of your body move. Do not let your shoulders lift up toward your ears. Stop when your elbows are at your sides or slightly behind your body. Hold for 1 second1. Slowly straighten your arms to return to the starting position. Repeat 2 times. Complete this exercise 3 times per week. Posture and body mechanics  Body mechanics refers to the movements and  positions of your body while you do your daily activities. Posture is part of body mechanics. Good posture and healthy body mechanics can help to relieve stress in your body's tissues and joints. Good posture means that your spine is in its natural S-curve position (your spine is neutral), your shoulders are pulled back slightly, and your head is not tipped forward. The following are general guidelines for applying improved posture and body mechanics to your everyday activities. Standing  When standing, keep your spine neutral and your feet about hip-width apart. Keep a slight bend in your knees. Your ears, shoulders, and hips should line up. When you do a task in which you lean forward while standing in one place for a long time, place one foot up on a stable object that is 2-4 inches (5-10 cm) high, such as a footstool. This helps keep your spine neutral. Sitting  When sitting, keep your spine neutral and keep your feet flat on the floor. Use a footrest, if necessary, and keep your thighs parallel to the floor. Avoid rounding your shoulders, and avoid tilting your head forward. When working at a desk or a computer, keep your desk at a height where your hands are slightly lower than your elbows. Slide your chair under your desk so you are close enough to maintain good posture. When working at a computer, place your monitor at a height where you are looking straight ahead and you do not have to tilt your head forward or downward to look at the screen. Resting  When lying down and resting, avoid positions that are most painful for you. If you have pain with activities such as sitting, bending, stooping, or squatting (flexion-based activities), lie in a position in which your body does not bend very much. For example, avoid curling up on your side with your arms and knees near your chest (fetal position). If you have pain with activities such as standing for a long time or reaching with your arms  (extension-based activities), lie with your spine in a neutral position and bend your knees slightly. Try the following positions: Lying on your side with a pillow between your knees. Lying on your back with a pillow under your knees.  Lifting  When lifting objects, keep your feet at least shoulder-width apart and tighten your abdominal muscles. Bend your knees and hips and keep your spine neutral. It is important to lift using the strength of your legs, not your back. Do not lock your knees straight out. Always ask for help to lift heavy or awkward objects. Make sure you discuss any questions you have with your health care provider. Document Released: 06/14/2005 Document Revised: 02/19/2016 Document Reviewed: 03/26/2015 Elsevier Interactive Patient Education  [employer].

## 2022-05-28 NOTE — Progress Notes (Signed)
Musculoskeletal Exam  Patient: Brenda Curry DOB: January 24, 1961  DOS: 05/28/2022  SUBJECTIVE:  Chief Complaint:   Chief Complaint  Patient presents with   Follow-up    Right kidney pain    Brenda Curry is a 61 y.o.  female for evaluation and treatment of R flank pain.   Onset:  1 week ago. No inj or change in activity.  Location: R flank Character:  throbbing  Progression of issue:  is unchanged Associated symptoms: some nausea No bruising, redness, swelling, urinary complaints, fevers +hx of kidney stones; dad had kidney stones Treatment: to date has been antibiotics.   Neurovascular symptoms: no  Past Medical History:  Diagnosis Date   Chicken pox    Frequent headaches    History of fainting spells of unknown cause    Migraines    Urinary tract infection     Objective: VITAL SIGNS: BP 132/80 (BP Location: Left Arm, Patient Position: Sitting, Cuff Size: Normal)   Pulse 60   Temp 97.8 F (36.6 C) (Oral)   Ht 5\' 9"  (1.753 m)   Wt 143 lb 6 oz (65 kg)   LMP  (LMP Unknown)   SpO2 99%   BMI 21.17 kg/m  Constitutional: Well formed, well developed. No acute distress. Thorax & Lungs: No accessory muscle use Musculoskeletal: mid back.   Tenderness to palpation: yes over lower R CVA Deformity: no Ecchymosis: no Tests positive: Lloyd's Tests negative: straight leg Neurologic: Normal sensory function. No focal deficits noted. DTR's equal and symmetric in LE's. No clonus. Psychiatric: Normal mood. Age appropriate judgment and insight. Alert & oriented x 3.    Assessment:  Right flank pain - Plan: DG Abd 2 Views  Plan: Phenergan prn for nausea. Ck XR. Hx of Ca ox stones. If +, will likely refer as this has been going on for a while. If neg, will send in muscle relaxer. Consider PT. Stretches/exercises, heat, ice, Tylenol.  F/u prn. The patient voiced understanding and agreement to the plan.   [place], DO 05/28/22  7:39 AM

## 2022-05-30 ENCOUNTER — Other Ambulatory Visit: Payer: Self-pay | Admitting: Family Medicine

## 2022-05-30 MED ORDER — TIZANIDINE HCL 4 MG PO TABS: 4.0000 mg | ORAL_TABLET | Freq: Four times a day (QID) | ORAL | 0 refills | Status: DC | PRN

## 2022-05-31 ENCOUNTER — Other Ambulatory Visit: Payer: Self-pay | Admitting: Family Medicine

## 2022-05-31 ENCOUNTER — Encounter: Payer: Self-pay | Admitting: Family Medicine

## 2022-05-31 DIAGNOSIS — R109 Unspecified abdominal pain: Secondary | ICD-10-CM

## 2022-06-02 ENCOUNTER — Ambulatory Visit (HOSPITAL_BASED_OUTPATIENT_CLINIC_OR_DEPARTMENT_OTHER)
Admission: RE | Admit: 2022-06-02 | Discharge: 2022-06-02 | Disposition: A | Discharge status: Home / Self Care | Payer: Medicare Other | Source: Ambulatory Visit | Attending: Family Medicine | Admitting: Family Medicine

## 2022-06-02 ENCOUNTER — Encounter: Payer: Self-pay | Admitting: Family Medicine

## 2022-06-02 ENCOUNTER — Ambulatory Visit (INDEPENDENT_AMBULATORY_CARE_PROVIDER_SITE_OTHER): Payer: Medicare Other | Admitting: Family Medicine

## 2022-06-02 VITALS — BP 130/86 | Ht 69.0 in | Wt 143.0 lb

## 2022-06-02 DIAGNOSIS — M5126 Other intervertebral disc displacement, lumbar region: Secondary | ICD-10-CM | POA: Insufficient documentation

## 2022-06-02 DIAGNOSIS — S39012A Strain of muscle, fascia and tendon of lower back, initial encounter: Secondary | ICD-10-CM

## 2022-06-02 DIAGNOSIS — M545 Low back pain, unspecified: Secondary | ICD-10-CM | POA: Diagnosis not present

## 2022-06-02 DIAGNOSIS — R109 Unspecified abdominal pain: Secondary | ICD-10-CM | POA: Diagnosis not present

## 2022-06-02 HISTORY — DX: Other intervertebral disc displacement, lumbar region: M51.26

## 2022-06-02 MED ORDER — METHYLPREDNISOLONE ACETATE 40 MG/ML IJ SUSP
40.0000 mg | Freq: Once | INTRAMUSCULAR | Status: AC
  Administered 2022-06-02: 40 mg via INTRAMUSCULAR

## 2022-06-02 MED ORDER — TRIAMCINOLONE ACETONIDE 40 MG/ML IJ SUSP: 40.0000 mg | Freq: Once | INTRAMUSCULAR | Status: DC

## 2022-06-02 MED ORDER — HYDROCODONE-ACETAMINOPHEN 5-325 MG PO TABS: 1.0000 | ORAL_TABLET | Freq: Three times a day (TID) | ORAL | 0 refills | Status: DC | PRN

## 2022-06-02 MED ORDER — KETOROLAC TROMETHAMINE 30 MG/ML IJ SOLN
30.0000 mg | Freq: Once | INTRAMUSCULAR | Status: AC
  Administered 2022-06-02: 30 mg via INTRAMUSCULAR

## 2022-06-02 NOTE — Assessment & Plan Note (Addendum)
Acutely occurring.  Having severe pain on the right side.  She has a history of previous surgery in 2017.  Pain seems most consistent with a spasm but she does have a trunk shift which would suggest a nerve involvement. -Counseled on home exercise therapy and supportive care. -IM Depo-Medrol and Toradol. - norco -X-ray. -MRI of the lumbar spine to evaluate for compression fracture and disc rupture due to her history of surgery given the severe pain she is currently in.  Also for presurgical planning.

## 2022-06-02 NOTE — Patient Instructions (Signed)
Nice to meet you Please try heat  Please try the stretches  Please use the pain medicine as needed  Please continue the ibuprofen and stop the celebrex   Please send me a message in MyChart with any questions or updates.  Please see me back next Monday.   --Dr. Jordan Likes

## 2022-06-02 NOTE — Progress Notes (Signed)
  Brenda Curry - 61 y.o. female MRN [id number]  Date of birth: [birth year]  SUBJECTIVE:  Including CC & ROS.  No chief complaint on file.   Brenda Curry is a 61 y.o. female that is presenting with acute low back and flank pain.  The pain has been ongoing since October.  It occurred after a sneeze.  She does have a history of lumbar surgery from 2017.  Not currently having any radicular pain.  Pain is still severe and ongoing.  No improvement with current modalities.   Independent review of the abdomen x-ray from 12/1 shows no acute changes.  Review of Systems See HPI   HISTORY: Past Medical, Surgical, Social, and Family History Reviewed & Updated per EMR.   Pertinent Historical Findings include:  Past Medical History:  Diagnosis Date   Chicken pox    Frequent headaches    History of fainting spells of unknown cause    Migraines    Urinary tract infection     Past Surgical History:  Procedure Laterality Date   BACK SURGERY  2007   BACK SURGERY  2009   NECK SURGERY  2007     PHYSICAL EXAM:  VS: BP 130/86   Ht 5\' 9"  (1.753 m)   Wt 143 lb (64.9 kg)   LMP  (LMP Unknown)   BMI 21.12 kg/m  Physical Exam Gen: NAD, alert, cooperative with exam, well-appearing MSK:  Neurovascularly intact       ASSESSMENT & PLAN:   Strain of lumbar region Acutely occurring.  Having severe pain on the right side.  She has a history of previous surgery in 2017.  Pain seems most consistent with a spasm but she does have a trunk shift which would suggest a nerve involvement. -Counseled on home exercise therapy and supportive care. -IM Depo-Medrol and Toradol. - norco -X-ray. -Could consider further imaging.

## 2022-06-03 ENCOUNTER — Encounter: Payer: Self-pay | Admitting: Family Medicine

## 2022-06-03 NOTE — Addendum Note (Signed)
Addended by: Myra Rude on: 06/03/2022 02:07 PM   Modules accepted: Orders

## 2022-06-07 ENCOUNTER — Encounter: Payer: Self-pay | Admitting: Family Medicine

## 2022-06-07 ENCOUNTER — Ambulatory Visit (INDEPENDENT_AMBULATORY_CARE_PROVIDER_SITE_OTHER): Payer: Medicare Other | Admitting: Family Medicine

## 2022-06-07 VITALS — BP 138/90 | Ht 69.0 in | Wt 143.0 lb

## 2022-06-07 DIAGNOSIS — M5126 Other intervertebral disc displacement, lumbar region: Secondary | ICD-10-CM | POA: Diagnosis not present

## 2022-06-07 MED ORDER — KETOROLAC TROMETHAMINE 30 MG/ML IJ SOLN
30.0000 mg | Freq: Once | INTRAMUSCULAR | Status: AC
  Administered 2022-06-07: 30 mg via INTRAMUSCULAR

## 2022-06-07 NOTE — Assessment & Plan Note (Signed)
Acutely occurring.  Still having severe pain ongoing for the past 6 weeks.  She has a history of surgery and concern for displacement of disc -Counseled on home exercise therapy and supportive care. -Toradol. -MRI of the lumbar spine to evaluate for disc displacement from previous surgery and for presurgical planning.

## 2022-06-07 NOTE — Patient Instructions (Addendum)
Good to see you Please continue heat  We'll get the mRI at [facility]  Please send me a message in MyChart with any questions or updates.  We'll setup a virtual visit once the MRI is resulted.   --Dr. Jordan Likes

## 2022-06-07 NOTE — Addendum Note (Signed)
Addended by: Cephas Darby on: 06/07/2022 08:59 AM   Modules accepted: Orders

## 2022-06-07 NOTE — Progress Notes (Signed)
  Brenda Curry - 61 y.o. female MRN [id number]  Date of birth: 27-Sep-1960  SUBJECTIVE:  Including CC & ROS.  No chief complaint on file.   Brenda Curry is a 61 y.o. female that is presenting with worsening low back and gluteus pain.  The pain is still severe.  The pain has been ongoing for 6 weeks.  She has a history of surgery from a few years ago.  She has had disc replacement at that time.    Review of Systems See HPI   HISTORY: Past Medical, Surgical, Social, and Family History Reviewed & Updated per EMR.   Pertinent Historical Findings include:  Past Medical History:  Diagnosis Date   Chicken pox    Frequent headaches    History of fainting spells of unknown cause    Migraines    Urinary tract infection     Past Surgical History:  Procedure Laterality Date   BACK SURGERY  2007   BACK SURGERY  2009   NECK SURGERY  2007     PHYSICAL EXAM:  VS: BP (!) 138/90 (BP Location: Right Arm, Patient Position: Sitting)   Ht 5\' 9"  (1.753 m)   Wt 143 lb (64.9 kg)   LMP  (LMP Unknown)   BMI 21.12 kg/m  Physical Exam Gen: NAD, alert, cooperative with exam, well-appearing MSK:  Back: Limited flexion and extension. Weakness with resistance in the hip flexion and extension. Neurovascularly intact       ASSESSMENT & PLAN:   Disc displacement, lumbar Acutely occurring.  Still having severe pain ongoing for the past 6 weeks.  She has a history of surgery and concern for displacement of disc -Counseled on home exercise therapy and supportive care. -Toradol. -MRI of the lumbar spine to evaluate for disc displacement from previous surgery and for presurgical planning.

## 2022-06-09 ENCOUNTER — Other Ambulatory Visit: Payer: Self-pay | Admitting: Family Medicine

## 2022-06-09 ENCOUNTER — Encounter: Payer: Self-pay | Admitting: Family Medicine

## 2022-06-09 MED ORDER — HYDROCODONE-ACETAMINOPHEN 5-325 MG PO TABS: 1.0000 | ORAL_TABLET | Freq: Three times a day (TID) | ORAL | 0 refills | Status: DC | PRN

## 2022-06-09 NOTE — Progress Notes (Signed)
Provided norco for pain.   Myra Rude, MD [facility] 06/09/2022, 5:31 PM

## 2022-06-10 ENCOUNTER — Ambulatory Visit
Admission: RE | Admit: 2022-06-10 | Discharge: 2022-06-10 | Disposition: A | Discharge status: Home / Self Care | Payer: Medicare Other | Source: Ambulatory Visit | Attending: Family Medicine | Admitting: Family Medicine

## 2022-06-10 DIAGNOSIS — M545 Low back pain, unspecified: Secondary | ICD-10-CM | POA: Diagnosis not present

## 2022-06-10 DIAGNOSIS — S39012A Strain of muscle, fascia and tendon of lower back, initial encounter: Secondary | ICD-10-CM

## 2022-06-15 ENCOUNTER — Telehealth (INDEPENDENT_AMBULATORY_CARE_PROVIDER_SITE_OTHER): Payer: Medicare Other | Admitting: Family Medicine

## 2022-06-15 ENCOUNTER — Encounter: Payer: Self-pay | Admitting: Family Medicine

## 2022-06-15 VITALS — Ht 69.0 in | Wt 143.0 lb

## 2022-06-15 DIAGNOSIS — E271 Primary adrenocortical insufficiency: Secondary | ICD-10-CM | POA: Diagnosis not present

## 2022-06-15 DIAGNOSIS — M47817 Spondylosis without myelopathy or radiculopathy, lumbosacral region: Secondary | ICD-10-CM | POA: Diagnosis not present

## 2022-06-15 NOTE — Assessment & Plan Note (Addendum)
Acutely occurring. Still having low back pain. Has facet arthrosis on MRI that appears to be the origin of her symptoms.  - counseled on home exercise therapy and supportive care - referral to physical therapy  - pursue facet injections Left L5-S1, L4-5 and Right L4-5

## 2022-06-15 NOTE — Progress Notes (Signed)
Virtual Visit via Video Note  I connected with Brenda Curry on 06/15/22 at  8:00 AM EST by a video enabled telemedicine application and verified that I am speaking with the correct person using two identifiers.  Location: Patient: home Provider: office   I discussed the limitations of evaluation and management by telemedicine and the availability of in person appointments. The patient expressed understanding and agreed to proceed.  History of Present Illness:  Brenda Curry is a 61 year old female that is following up after the MRI of her lumbar spine.  She continues to have low back pain and no radicular symptoms.  The MRI was demonstrating facet arthritis on the left L5-S1 and left and right L4-L5.  Showed moderate foraminal narrowing and mild scoliosis.  Observations/Objective:   Assessment and Plan:  Facet arthropathy of the lumbosacral region: Acutely occurring. Still having low back pain. Has facet arthrosis on MRI that appears to be the origin of her symptoms.  - counseled on home exercise therapy and supportive care - referral to physical therapy  - pursue facet injections Left L5-S1, L4-5 and Right L4-5  Follow Up Instructions:    I discussed the assessment and treatment plan with the patient. The patient was provided an opportunity to ask questions and all were answered. The patient agreed with the plan and demonstrated an understanding of the instructions.   The patient was advised to call back or seek an in-person evaluation if the symptoms worsen or if the condition fails to improve as anticipated.    Brenda Gandy, MD

## 2022-06-17 ENCOUNTER — Telehealth: Payer: Medicare Other | Admitting: Family Medicine

## 2022-06-29 ENCOUNTER — Ambulatory Visit
Admission: RE | Admit: 2022-06-29 | Discharge: 2022-06-29 | Disposition: A | Discharge status: Home / Self Care | Payer: Medicare Other | Source: Ambulatory Visit | Attending: Family Medicine | Admitting: Family Medicine

## 2022-06-29 ENCOUNTER — Other Ambulatory Visit: Payer: Self-pay | Admitting: Family Medicine

## 2022-06-29 DIAGNOSIS — M545 Low back pain, unspecified: Secondary | ICD-10-CM | POA: Diagnosis not present

## 2022-06-29 DIAGNOSIS — M47817 Spondylosis without myelopathy or radiculopathy, lumbosacral region: Secondary | ICD-10-CM

## 2022-06-29 MED ORDER — IOPAMIDOL (ISOVUE-M 200) INJECTION 41%
1.0000 mL | Freq: Once | INTRAMUSCULAR | Status: AC
  Administered 2022-06-29: 1 mL via INTRA_ARTICULAR

## 2022-06-29 MED ORDER — METHYLPREDNISOLONE ACETATE 40 MG/ML INJ SUSP (RADIOLOG
80.0000 mg | Freq: Once | INTRAMUSCULAR | Status: AC
  Administered 2022-06-29: 80 mg via INTRA_ARTICULAR

## 2022-06-29 NOTE — Discharge Instructions (Signed)

## 2022-07-01 ENCOUNTER — Encounter: Payer: Self-pay | Admitting: Family Medicine

## 2022-07-02 ENCOUNTER — Other Ambulatory Visit: Payer: Self-pay | Admitting: Family Medicine

## 2022-07-02 MED ORDER — HYDROCODONE-ACETAMINOPHEN 5-325 MG PO TABS: 1.0000 | ORAL_TABLET | Freq: Three times a day (TID) | ORAL | 0 refills | Status: DC | PRN

## 2022-07-02 NOTE — Progress Notes (Signed)
Refilled norco.   Rosemarie Ax, MD [facility] 07/02/2022, 8:23 AM

## 2022-07-07 ENCOUNTER — Other Ambulatory Visit: Payer: Self-pay | Admitting: Family Medicine

## 2022-07-07 DIAGNOSIS — G8929 Other chronic pain: Secondary | ICD-10-CM

## 2022-07-13 ENCOUNTER — Other Ambulatory Visit: Payer: Self-pay | Admitting: Family Medicine

## 2022-07-13 MED ORDER — HYDROCODONE-ACETAMINOPHEN 5-325 MG PO TABS: 1.0000 | ORAL_TABLET | Freq: Three times a day (TID) | ORAL | 0 refills | Status: DC | PRN

## 2022-08-06 ENCOUNTER — Other Ambulatory Visit: Payer: Self-pay | Admitting: Family Medicine

## 2022-08-06 DIAGNOSIS — G8929 Other chronic pain: Secondary | ICD-10-CM

## 2022-08-07 ENCOUNTER — Other Ambulatory Visit: Payer: Self-pay | Admitting: Family Medicine

## 2022-08-09 ENCOUNTER — Encounter: Payer: Self-pay | Admitting: Family Medicine

## 2022-09-21 ENCOUNTER — Telehealth: Payer: Self-pay | Admitting: Family Medicine

## 2022-09-21 NOTE — Telephone Encounter (Signed)
Contacted Brenda Curry to schedule their annual wellness visit. Call back at later date: She did not have her appt book will call back  [name]; [name]: [phone]

## 2022-10-11 ENCOUNTER — Encounter: Payer: Self-pay | Admitting: *Deleted

## 2022-12-20 ENCOUNTER — Ambulatory Visit (INDEPENDENT_AMBULATORY_CARE_PROVIDER_SITE_OTHER): Payer: Medicare Other | Admitting: *Deleted

## 2022-12-20 DIAGNOSIS — Z0000 Encounter for general adult medical examination without abnormal findings: Secondary | ICD-10-CM

## 2022-12-20 NOTE — Patient Instructions (Signed)
Brenda Curry , Thank you for taking time to come for your Medicare Wellness Visit. I appreciate your ongoing commitment to your health goals. Please review the following plan we discussed and let me know if I can assist you in the future.     This is a list of the screening recommended for you and due dates:  Health Maintenance  Topic Date Due   Pap Smear  08/15/2022   Mammogram  08/25/2022   Flu Shot  01/27/2023   Medicare Annual Wellness Visit  12/20/2023   Colon Cancer Screening  07/29/2025   DTaP/Tdap/Td vaccine (2 - Td or Tdap) 06/29/2028   Hepatitis C Screening  Completed   HIV Screening  Completed   HPV Vaccine  Aged Out   COVID-19 Vaccine  Discontinued   Zoster (Shingles) Vaccine  Discontinued     Next appointment: Follow up in one year for your annual wellness visit.   Preventive Care 40-64 Years, Female Preventive care refers to lifestyle choices and visits with your health care provider that can promote health and wellness. What does preventive care include? A yearly physical exam. This is also called an annual well check. Dental exams once or twice a year. Routine eye exams. Ask your health care provider how often you should have your eyes checked. Personal lifestyle choices, including: Daily care of your teeth and gums. Regular physical activity. Eating a healthy diet. Avoiding tobacco and drug use. Limiting alcohol use. Practicing safe sex. Taking low-dose aspirin daily starting at age 9. Taking vitamin and mineral supplements as recommended by your health care provider. What happens during an annual well check? The services and screenings done by your health care provider during your annual well check will depend on your age, overall health, lifestyle risk factors, and family history of disease. Counseling  Your health care provider may ask you questions about your: Alcohol use. Tobacco use. Drug use. Emotional well-being. Home and relationship  well-being. Sexual activity. Eating habits. Work and work Astr[employer]. Method of birth control. Menstrual cycle. Pregnancy history. Screening  You may have the following tests or measurements: Height, weight, and BMI. Blood pressure. Lipid and cholesterol levels. These may be checked every 5 years, or more frequently if you are over 76 years old. Skin check. Lung cancer screening. You may have this screening every year starting at age 81 if you have a 30-pack-year history of smoking and currently smoke or have quit within the past 15 years. Fecal occult blood test (FOBT) of the stool. You may have this test every year starting at age 62. Flexible sigmoidoscopy or colonoscopy. You may have a sigmoidoscopy every 5 years or a colonoscopy every 10 years starting at age 56. Hepatitis C blood test. Hepatitis B blood test. Sexually transmitted disease (STD) testing. Diabetes screening. This is done by checking your blood sugar (glucose) after you have not eaten for a while (fasting). You may have this done every 1-3 years. Mammogram. This may be done every 1-2 years. Talk to your health care provider about when you should start having regular mammograms. This may depend on whether you have a family history of breast cancer. BRCA-related cancer screening. This may be done if you have a family history of breast, ovarian, tubal, or peritoneal cancers. Pelvic exam and Pap test. This may be done every 3 years starting at age 55. Starting at age 66, this may be done every 5 years if you have a Pap test in combination with an HPV test. Bone  density scan. This is done to screen for osteoporosis. You may have this scan if you are at high risk for osteoporosis. Discuss your test results, treatment options, and if necessary, the need for more tests with your health care provider. Vaccines  Your health care provider may recommend certain vaccines, such as: Influenza vaccine. This is recommended every  year. Tetanus, diphtheria, and acellular pertussis (Tdap, Td) vaccine. You may need a Td booster every 10 years. Zoster vaccine. You may need this after age 43. Pneumococcal 13-valent conjugate (PCV13) vaccine. You may need this if you have certain conditions and were not previously vaccinated. Pneumococcal polysaccharide (PPSV23) vaccine. You may need one or two doses if you smoke cigarettes or if you have certain conditions. Talk to your health care provider about which screenings and vaccines you need and how often you need them. This information is not intended to replace advice given to you by your health care provider. Make sure you discuss any questions you have with your health care provider. Document Released: 07/11/2015 Document Revised: 03/03/2016 Document Reviewed: 04/15/2015 Elsevier Interactive Patient Education  2017 [employer].    Fall Prevention in the Home Falls can cause injuries. They can happen to people of all ages. There are many things you can do to make your home safe and to help prevent falls. What can I do on the outside of my home? Regularly fix the edges of walkways and driveways and fix any cracks. Remove anything that might make you trip as you walk through a door, such as a raised step or threshold. Trim any bushes or trees on the path to your home. Use bright outdoor lighting. Clear any walking paths of anything that might make someone trip, such as rocks or tools. Regularly check to see if handrails are loose or broken. Make sure that both sides of any steps have handrails. Any raised decks and porches should have guardrails on the edges. Have any leaves, snow, or ice cleared regularly. Use sand or salt on walking paths during winter. Clean up any spills in your garage right away. This includes oil or grease spills. What can I do in the bathroom? Use night lights. Install grab bars by the toilet and in the tub and shower. Do not use towel bars as grab  bars. Use non-skid mats or decals in the tub or shower. If you need to sit down in the shower, use a plastic, non-slip stool. Keep the floor dry. Clean up any water that spills on the floor as soon as it happens. Remove soap buildup in the tub or shower regularly. Attach bath mats securely with double-sided non-slip rug tape. Do not have throw rugs and other things on the floor that can make you trip. What can I do in the bedroom? Use night lights. Make sure that you have a light by your bed that is easy to reach. Do not use any sheets or blankets that are too big for your bed. They should not hang down onto the floor. Have a firm chair that has side arms. You can use this for support while you get dressed. Do not have throw rugs and other things on the floor that can make you trip. What can I do in the kitchen? Clean up any spills right away. Avoid walking on wet floors. Keep items that you use a lot in easy-to-reach places. If you need to reach something above you, use a strong step stool that has a grab bar. Keep  electrical cords out of the way. Do not use floor polish or wax that makes floors slippery. If you must use wax, use non-skid floor wax. Do not have throw rugs and other things on the floor that can make you trip. What can I do with my stairs? Do not leave any items on the stairs. Make sure that there are handrails on both sides of the stairs and use them. Fix handrails that are broken or loose. Make sure that handrails are as long as the stairways. Check any carpeting to make sure that it is firmly attached to the stairs. Fix any carpet that is loose or worn. Avoid having throw rugs at the top or bottom of the stairs. If you do have throw rugs, attach them to the floor with carpet tape. Make sure that you have a light switch at the top of the stairs and the bottom of the stairs. If you do not have them, ask someone to add them for you. What else can I do to help prevent  falls? Wear shoes that: Do not have high heels. Have rubber bottoms. Are comfortable and fit you well. Are closed at the toe. Do not wear sandals. If you use a stepladder: Make sure that it is fully opened. Do not climb a closed stepladder. Make sure that both sides of the stepladder are locked into place. Ask someone to hold it for you, if possible. Clearly mark and make sure that you can see: Any grab bars or handrails. First and last steps. Where the edge of each step is. Use tools that help you move around (mobility aids) if they are needed. These include: Canes. Walkers. Scooters. Crutches. Turn on the lights when you go into a dark area. Replace any light bulbs as soon as they burn out. Set up your furniture so you have a clear path. Avoid moving your furniture around. If any of your floors are uneven, fix them. If there are any pets around you, be aware of where they are. Review your medicines with your doctor. Some medicines can make you feel dizzy. This can increase your chance of falling. Ask your doctor what other things that you can do to help prevent falls. This information is not intended to replace advice given to you by your health care provider. Make sure you discuss any questions you have with your health care provider. Document Released: 04/10/2009 Document Revised: 11/20/2015 Document Reviewed: 07/19/2014 Elsevier Interactive Patient Education  2017 [employer].

## 2022-12-20 NOTE — Progress Notes (Signed)
Subjective:   Brenda Curry is a 62 y.o. female who presents for Medicare Annual (Subsequent) preventive examination.  Visit Complete: Virtual  I connected with  Brenda Curry on 12/20/22 by a audio enabled telemedicine application and verified that I am speaking with the correct person using two identifiers.  Patient Location: Home  Provider Location: Office/Clinic  I discussed the limitations of evaluation and management by telemedicine. The patient expressed understanding and agreed to proceed.   Review of Systems     Cardiac Risk Factors include: advanced age (>90men, >42 women);hypertension     Objective:    Today's Vitals   There is no height or weight on file to calculate BMI.     12/20/2022    2:28 PM 09/28/2021    3:36 PM 09/17/2020    9:38 AM 07/05/2019    9:07 AM 06/29/2018    9:57 AM  Advanced Directives  Does Patient Have a Medical Advance Directive? No No No No No  Would patient like information on creating a medical advance directive? No - Patient declined  No - Patient declined No - Patient declined No - Patient declined    Current Medications (verified) Outpatient Encounter Medications as of 12/20/2022  Medication Sig   tiZANidine (ZANAFLEX) 4 MG tablet Take 1 tablet (4 mg total) by mouth every 6 (six) hours as needed for muscle spasms.   celecoxib (CELEBREX) 100 MG capsule Take 1 capsule by mouth twice daily   fludrocortisone (FLORINEF) 0.1 MG tablet Take 1 tablet (0.1 mg total) by mouth daily.   HYDROcodone-acetaminophen (NORCO/VICODIN) 5-325 MG tablet Take 1 tablet by mouth every 8 (eight) hours as needed.   ibuprofen (ADVIL,MOTRIN) 200 MG tablet Take 200 mg by mouth every 6 (six) hours as needed for moderate pain.   midodrine (PROAMATINE) 5 MG tablet Take 1 tablet (5 mg total) by mouth 2 (two) times daily.   promethazine (PHENERGAN) 25 MG tablet Take 1 tablet (25 mg total) by mouth every 8 (eight) hours as needed for nausea or vomiting.    No facility-administered encounter medications on file as of 12/20/2022.    Allergies (verified) Bactrim [sulfamethoxazole-trimethoprim] and Tape   History: Past Medical History:  Diagnosis Date   Chicken pox    Frequent headaches    History of fainting spells of unknown cause    Hypertension    Migraines    Urinary tract infection    Past Surgical History:  Procedure Laterality Date   BACK SURGERY  2007   BACK SURGERY  2009   NECK SURGERY  2007   SPINE SURGERY  2006   Family History  Problem Relation Age of Onset   Hearing loss Mother    Hyperlipidemia Mother    Aortic aneurysm Mother    Biliary Cirrhosis Mother    Cancer Father    Heart attack Father    Heart disease Father    High Cholesterol Father    Hypertension Father    Aortic aneurysm Father    Hashimoto's thyroiditis Sister    Heart disease Brother    Atrial fibrillation Brother    Kidney disease Brother    Varicose Veins Brother    Diabetes Maternal Grandmother    Alcohol abuse Paternal Grandfather    Cancer Paternal Grandfather    Heart disease Paternal Grandfather    Diabetes Brother    Hyperlipidemia Brother    Obesity Brother    Thyroid disease Cousin    Social History   Socioeconomic History  Marital status: Divorced    Spouse name: Not on file   Number of children: Not on file   Years of education: Not on file   Highest education level: Not on file  Occupational History   Not on file  Tobacco Use   Smoking status: Former    Years: 36    Types: Cigarettes    Quit date: 09/27/2018    Years since quitting: 4.2   Smokeless tobacco: Never  Vaping Use   Vaping Use: Former  Substance and Sexual Activity   Alcohol use: Not Currently    Comment: rarely   Drug use: Never   Sexual activity: Not Currently  Other Topics Concern   Not on file  Social History Narrative   Not on file   Social Determinants of Health   Financial Resource Strain: Low Risk  (09/28/2021)   Overall Financial  Resource Strain (CARDIA)    Difficulty of Paying Living Expenses: Not hard at all  Food Insecurity: No Food Insecurity (12/20/2022)   Hunger Vital Sign    Worried About Running Out of Food in the Last Year: Never true    Ran Out of Food in the Last Year: Never true  Transportation Needs: No Transportation Needs (12/20/2022)   PRAPARE - Administrator, Civil Service (Medical): No    Lack of Transportation (Non-Medical): No  Physical Activity: Sufficiently Active (12/20/2022)   Exercise Vital Sign    Days of Exercise per Week: 4 days    Minutes of Exercise per Session: 60 min  Stress: No Stress Concern Present (09/28/2021)   [employer] of Occupational Health - Occupational Stress Questionnaire    Feeling of Stress : Not at all  Social Connections: Moderately Isolated (09/28/2021)   Social Connection and Isolation Panel [NHANES]    Frequency of Communication with Friends and Family: More than three times a week    Frequency of Social Gatherings with Friends and Family: Three times a week    Attends Religious Services: 1 to 4 times per year    Active Member of Clubs or Organizations: No    Attends Banker Meetings: Never    Marital Status: Divorced    Tobacco Counseling Counseling given: Not Answered   Clinical Intake:  Pre-visit preparation completed: Yes  Pain : No/denies pain  Nutritional Risks: None Diabetes: No  How often do you need to have someone help you when you read instructions, pamphlets, or other written materials from your doctor or pharmacy?: 1 - Never  Interpreter Needed?: No  Information entered by :: Donne Anon, CMA   Activities of Daily Living    12/20/2022    2:29 PM  In your present state of health, do you have any difficulty performing the following activities:  Hearing? 0  Vision? 0  Difficulty concentrating or making decisions? 0  Walking or climbing stairs? 1  Comment sometimes  Dressing or bathing? 0  Doing  errands, shopping? 0  Preparing Food and eating ? N  Using the Toilet? N  In the past six months, have you accidently leaked urine? N  Do you have problems with loss of bowel control? N  Managing your Medications? N  Managing your Finances? N  Housekeeping or managing your Housekeeping? N    Patient Care Team: Sharlene Dory, DO as PCP - General (Family Medicine)  Indicate any recent Medical Services you may have received from other than [facility] providers in the past year (date may be approximate).  Assessment:   This is a routine wellness examination for [facility].  Hearing/Vision screen No results found.  Dietary issues and exercise activities discussed:     Goals Addressed   None    Depression Screen    12/20/2022    2:34 PM 09/28/2021    3:38 PM 11/18/2020    3:29 PM 08/15/2020    9:45 AM 07/05/2019    9:11 AM 06/29/2018    9:58 AM 05/05/2018    1:27 PM  PHQ 2/9 Scores  PHQ - 2 Score 0 0 0 0 1 4 4   PHQ- 9 Score      11 14    Fall Risk    12/20/2022    2:29 PM 09/28/2021    3:37 PM 11/18/2020    3:29 PM 07/05/2019    9:11 AM 06/29/2018    9:58 AM  Fall Risk   Falls in the past year? 0 0 0 0 0  Number falls in past yr: 0 0 0    Injury with Fall? 0 0 0    Risk for fall due to : No Fall Risks  History of fall(s)    Follow up Falls evaluation completed Falls prevention discussed Falls evaluation completed Education provided;Falls prevention discussed     MEDICARE RISK AT HOME:  Medicare Risk at Home - 12/20/22 1431     Any stairs in or around the home? Yes    If so, are there any without handrails? No    Home free of loose throw rugs in walkways, pet beds, electrical cords, etc? Yes    Adequate lighting in your home to reduce risk of falls? Yes    Life alert? No    Use of a cane, walker or w/c? Yes    Grab bars in the bathroom? No    Shower chair or bench in shower? Yes   built in bench   Elevated toilet seat or a handicapped toilet? No              TIMED UP AND GO:  Was the test performed?  No    Cognitive Function:    06/29/2018   10:11 AM  MMSE - Mini Mental State Exam  Orientation to time 5  Orientation to Place 5  Registration 3  Attention/ Calculation 5  Recall 1  Language- name 2 objects 2  Language- repeat 1  Language- follow 3 step command 3  Language- read & follow direction 1  Write a sentence 1  Copy design 1  Total score 28        12/20/2022    2:35 PM  6CIT Screen  What Year? 0 points  What month? 0 points  What time? 0 points  Count back from 20 0 points  Months in reverse 0 points  Repeat phrase 0 points  Total Score 0 points    Immunizations Immunization History  Administered Date(s) Administered   Influenza,inj,Quad PF,6+ Mos 03/23/2018, 08/15/2019   Tdap 06/29/2018    TDAP status: Up to date  Flu Vaccine status: Up to date  Pneumococcal vaccine status: Due, Education has been provided regarding the importance of this vaccine. Advised may receive this vaccine at local pharmacy or Health Dept. Aware to provide a copy of the vaccination record if obtained from local pharmacy or Health Dept. Verbalized acceptance and understanding.  Covid-19 vaccine status: Declined, Education has been provided regarding the importance of this vaccine but patient still declined. Advised may receive this vaccine at local  pharmacy or Health Dept.or vaccine clinic. Aware to provide a copy of the vaccination record if obtained from local pharmacy or Health Dept. Verbalized acceptance and understanding.  Qualifies for Shingles Vaccine? Yes   Zostavax completed No   Shingrix Completed?: No.    Education has been provided regarding the importance of this vaccine. Patient has been advised to call insurance company to determine out of pocket expense if they have not yet received this vaccine. Advised may also receive vaccine at local pharmacy or Health Dept. Verbalized acceptance and understanding.  Screening  Tests Health Maintenance  Topic Date Due   PAP SMEAR-Modifier  08/15/2022   MAMMOGRAM  08/25/2022   Medicare Annual Wellness (AWV)  09/29/2022   INFLUENZA VACCINE  01/27/2023   Colonoscopy  07/29/2025   DTaP/Tdap/Td (2 - Td or Tdap) 06/29/2028   Hepatitis C Screening  Completed   HIV Screening  Completed   HPV VACCINES  Aged Out   COVID-19 Vaccine  Discontinued   Zoster Vaccines- Shingrix  Discontinued    Health Maintenance  Health Maintenance Due  Topic Date Due   PAP SMEAR-Modifier  08/15/2022   MAMMOGRAM  08/25/2022   Medicare Annual Wellness (AWV)  09/29/2022    Colorectal cancer screening: Type of screening: Colonoscopy. Completed 07/30/15. Repeat every 10 years  Mammogram status: Completed 08/25/20. Repeat every year  Bone Density status: doesn't qualify  Lung Cancer Screening: (Low Dose CT Chest recommended if Age 41-80 years, 20 pack-year currently smoking OR have quit w/in 15years.) does not qualify.   Additional Screening:  Hepatitis C Screening: does qualify; Completed 08/14/97  Vision Screening: Recommended annual ophthalmology exams for early detection of glaucoma and other disorders of the eye. Is the patient up to date with their annual eye exam?  No  Who is the provider or what is the name of the office in which the patient attends annual eye exams? [facility] If pt is not established with a provider, would they like to be referred to a provider to establish care? No .   Dental Screening: Recommended annual dental exams for proper oral hygiene  Diabetic Foot Exam: N/a  Community Resource Referral / Chronic Care Management: CRR required this visit?  No   CCM required this visit?  No     Plan:     I have personally reviewed and noted the following in the patient's chart:   Medical and social history Use of alcohol, tobacco or illicit drugs  Current medications and supplements including opioid prescriptions. Patient is currently taking opioid  prescriptions. Information provided to patient regarding non-opioid alternatives. Patient advised to discuss non-opioid treatment plan with their provider. Functional ability and status Nutritional status Physical activity Advanced directives List of other physicians Hospitalizations, surgeries, and ER visits in previous 12 months Vitals Screenings to include cognitive, depression, and falls Referrals and appointments  In addition, I have reviewed and discussed with patient certain preventive protocols, quality metrics, and best practice recommendations. A written personalized care plan for preventive services as well as general preventive health recommendations were provided to patient.     Donne Anon, CMA   12/20/2022   After Visit Summary: (MyChart) Due to this being a telephonic visit, the after visit summary with patients personalized plan was offered to patient via MyChart   Nurse Notes: None

## 2023-04-04 ENCOUNTER — Other Ambulatory Visit: Payer: Self-pay | Admitting: Family Medicine

## 2023-04-04 DIAGNOSIS — G8929 Other chronic pain: Secondary | ICD-10-CM

## 2023-05-07 ENCOUNTER — Other Ambulatory Visit: Payer: Self-pay | Admitting: Family Medicine

## 2023-05-07 DIAGNOSIS — G8929 Other chronic pain: Secondary | ICD-10-CM

## 2023-05-09 ENCOUNTER — Other Ambulatory Visit: Payer: Self-pay | Admitting: Family Medicine

## 2023-05-09 DIAGNOSIS — G8929 Other chronic pain: Secondary | ICD-10-CM

## 2023-06-10 ENCOUNTER — Other Ambulatory Visit: Payer: Self-pay | Admitting: Family Medicine

## 2023-06-10 DIAGNOSIS — M545 Low back pain, unspecified: Secondary | ICD-10-CM

## 2023-10-28 ENCOUNTER — Other Ambulatory Visit: Payer: Self-pay | Admitting: Family Medicine

## 2023-10-28 DIAGNOSIS — M545 Low back pain, unspecified: Secondary | ICD-10-CM

## 2023-12-10 ENCOUNTER — Other Ambulatory Visit: Payer: Self-pay | Admitting: Family Medicine

## 2023-12-10 DIAGNOSIS — G8929 Other chronic pain: Secondary | ICD-10-CM

## 2023-12-26 ENCOUNTER — Ambulatory Visit (INDEPENDENT_AMBULATORY_CARE_PROVIDER_SITE_OTHER): Admitting: *Deleted

## 2023-12-26 ENCOUNTER — Telehealth: Payer: Self-pay

## 2023-12-26 VITALS — Ht 69.0 in | Wt 143.0 lb

## 2023-12-26 DIAGNOSIS — Z0000 Encounter for general adult medical examination without abnormal findings: Secondary | ICD-10-CM | POA: Diagnosis not present

## 2023-12-26 DIAGNOSIS — H269 Unspecified cataract: Secondary | ICD-10-CM | POA: Insufficient documentation

## 2023-12-26 HISTORY — DX: Unspecified cataract: H26.9

## 2023-12-26 NOTE — Telephone Encounter (Signed)
 done

## 2023-12-26 NOTE — Progress Notes (Signed)
 Subjective:   Brenda Curry is a 63 y.o. who presents for a Medicare Wellness preventive visit.  As a reminder, Annual Wellness Visits don't include a physical exam, and some assessments may be limited, especially if this visit is performed virtually. We may recommend an in-person follow-up visit with your provider if needed.  Visit Complete: Virtual I connected with  [name] on 12/26/23 by a audio enabled telemedicine application and verified that I am speaking with the correct person using two identifiers.  Patient Location: Home  Provider Location: Office/Clinic  I discussed the limitations of evaluation and management by telemedicine. The patient expressed understanding and agreed to proceed.  Vital Signs: Because this visit was a virtual/telehealth visit, some criteria may be missing or patient reported. Any vitals not documented were not able to be obtained and vitals that have been documented are patient reported.  VideoDeclined- This patient declined Librarian, academic. Therefore the visit was completed with audio only.  Persons Participating in Visit: Patient.  AWV Questionnaire: Yes: Patient Medicare AWV questionnaire was completed by the patient on 12/20/23; I have confirmed that all information answered by patient is correct and no changes since this date.  Cardiac Risk Factors include: none     Objective:    Today's Vitals   12/26/23 1451  Weight: 143 lb (64.9 kg)  Height: 5' 9 (1.753 m)   Body mass index is 21.12 kg/m.     12/26/2023    3:09 PM 12/20/2022    2:28 PM 09/28/2021    3:36 PM 09/17/2020    9:38 AM 07/05/2019    9:07 AM 06/29/2018    9:57 AM  Advanced Directives  Does Patient Have a Medical Advance Directive? No No No No No No   Would patient like information on creating a medical advance directive? Yes (MAU/Ambulatory/Procedural Areas - Information given) No - Patient declined  No - Patient declined  No - Patient declined No - Patient declined      Data saved with a previous flowsheet row definition    Current Medications (verified) Outpatient Encounter Medications as of 12/26/2023  Medication Sig   celecoxib  (CELEBREX ) 100 MG capsule Take 1 capsule by mouth twice daily   fludrocortisone  (FLORINEF ) 0.1 MG tablet Take 1 tablet (0.1 mg total) by mouth daily.   ibuprofen (ADVIL,MOTRIN) 200 MG tablet Take 200 mg by mouth every 6 (six) hours as needed for moderate pain.   midodrine  (PROAMATINE ) 5 MG tablet Take 1 tablet (5 mg total) by mouth 2 (two) times daily.   promethazine  (PHENERGAN ) 25 MG tablet Take 1 tablet (25 mg total) by mouth every 8 (eight) hours as needed for nausea or vomiting.   tiZANidine  (ZANAFLEX ) 4 MG tablet TAKE 1 TABLET BY MOUTH EVERY 6 HOURS AS NEEDED FOR MUSCLE SPASM   [DISCONTINUED] HYDROcodone -acetaminophen  (NORCO/VICODIN) 5-325 MG tablet Take 1 tablet by mouth every 8 (eight) hours as needed.   No facility-administered encounter medications on file as of 12/26/2023.    Allergies (verified) Bactrim  [sulfamethoxazole -trimethoprim ] and Tape   History: Past Medical History:  Diagnosis Date   Bilateral cataracts 12/26/2023   monitoring   Chicken pox    Frequent headaches    History of fainting spells of unknown cause    Hypertension    Migraines    Urinary tract infection    Past Surgical History:  Procedure Laterality Date   BACK SURGERY  2007   BACK SURGERY  2009   NECK SURGERY  2007   SPINE SURGERY  2006   Family History  Problem Relation Age of Onset   Hearing loss Mother    Hyperlipidemia Mother    Aortic aneurysm Mother    Biliary Cirrhosis Mother    Cancer Father    Heart attack Father    Heart disease Father    High Cholesterol Father    Hypertension Father    Aortic aneurysm Father    Hashimoto's thyroiditis Sister    Heart disease Brother    Atrial fibrillation Brother    Kidney disease Brother    Varicose Veins Brother     Diabetes Maternal Grandmother    Alcohol abuse Paternal Grandfather    Cancer Paternal Grandfather    Heart disease Paternal Grandfather    Diabetes Brother    Hyperlipidemia Brother    Obesity Brother    Thyroid  disease Cousin    Social History   Socioeconomic History   Marital status: Divorced    Spouse name: Not on file   Number of children: Not on file   Years of education: Not on file   Highest education level: Not on file  Occupational History   Not on file  Tobacco Use   Smoking status: Former    Current packs/day: 0.00    Types: Cigarettes    Start date: 09/27/1982    Quit date: 09/27/2018    Years since quitting: 5.2   Smokeless tobacco: Never  Vaping Use   Vaping status: Former  Substance and Sexual Activity   Alcohol use: Not Currently    Comment: rarely   Drug use: Never   Sexual activity: Not Currently  Other Topics Concern   Not on file  Social History Narrative   Not on file   Social Drivers of Health   Financial Resource Strain: Low Risk  (12/26/2023)   Overall Financial Resource Strain (CARDIA)    Difficulty of Paying Living Expenses: Not hard at all  Food Insecurity: No Food Insecurity (12/26/2023)   Hunger Vital Sign    Worried About Running Out of Food in the Last Year: Never true    Ran Out of Food in the Last Year: Never true  Transportation Needs: No Transportation Needs (12/26/2023)   PRAPARE - Administrator, Civil Service (Medical): No    Lack of Transportation (Non-Medical): No  Physical Activity: Sufficiently Active (12/26/2023)   Exercise Vital Sign    Days of Exercise per Week: 7 days    Minutes of Exercise per Session: 40 min  Stress: No Stress Concern Present (12/26/2023)   [employer] of Occupational Health - Occupational Stress Questionnaire    Feeling of Stress: Not at all  Social Connections: Moderately Isolated (12/26/2023)   Social Connection and Isolation Panel    Frequency of Communication with Friends and  Family: More than three times a week    Frequency of Social Gatherings with Friends and Family: More than three times a week    Attends Religious Services: 1 to 4 times per year    Active Member of [employer] or Organizations: No    Attends Banker Meetings: Never    Marital Status: Divorced    Tobacco Counseling Counseling given: Not Answered    Clinical Intake:  Pre-visit preparation completed: Yes        BMI - recorded: 21.12 Nutritional Status: BMI of 19-24  Normal Nutritional Risks: None Diabetes: No  Lab Results  Component Value Date   HGBA1C 5.4 08/18/2021  How often do you need to have someone help you when you read instructions, pamphlets, or other written materials from your doctor or pharmacy?: 1 - Never  Interpreter Needed?: No  Information entered by :: Lolita Libra, CMA   Activities of Daily Living     12/20/2023    8:12 AM  In your present state of health, do you have any difficulty performing the following activities:  Hearing? 0  Vision? 1   1  Comment has bilateral cataracts  Difficulty concentrating or making decisions? 0  Walking or climbing stairs? 1   1  Comment due to back condition, will schedule appt with sports medicine again soon  Dressing or bathing? 0  Doing errands, shopping? 0  Preparing Food and eating ? N  Using the Toilet? N  In the past six months, have you accidently leaked urine? N  Do you have problems with loss of bowel control? N  Managing your Medications? N  Managing your Finances? N  Housekeeping or managing your Housekeeping? N    Patient Care Team: Frann Mabel Mt, DO as PCP - [facility]  I have updated your Care Teams any recent Medical Services you may have received from other providers in the past year.     Assessment:   This is a routine wellness examination for [facility].  Hearing/Vision screen Hearing Screening -  Comments:: Denies hearing difficulties.  Vision Screening - Comments:: Wears RX glasses -- up to date with routine eye exams w/[facility].  Has bilateral cataracts.    Goals Addressed             This Visit's Progress    Increase physical activity   On track      Depression Screen     12/26/2023    3:06 PM 12/20/2022    2:34 PM 09/28/2021    3:38 PM 11/18/2020    3:29 PM 08/15/2020    9:45 AM 07/05/2019    9:11 AM 06/29/2018    9:58 AM  PHQ 2/9 Scores  PHQ - 2 Score 0 0 0 0 0 1 4  PHQ- 9 Score 2      11    Fall Risk     12/20/2023    8:12 AM 12/20/2022    2:29 PM 09/28/2021    3:37 PM 11/18/2020    3:29 PM 07/05/2019    9:11 AM  Fall Risk   Falls in the past year? 0 0 0 0 0   Number falls in past yr: 0 0 0 0   Injury with Fall? 0 0 0 0   Risk for fall due to :  No Fall Risks  History of fall(s)   Follow up  Falls evaluation completed Falls prevention discussed  Falls evaluation completed  Education provided;Falls prevention discussed      Data saved with a previous flowsheet row definition    MEDICARE RISK AT HOME:  Medicare Risk at Home Any stairs in or around the home?: (Patient-Rptd) Yes If so, are there any without handrails?: (Patient-Rptd) No Home free of loose throw rugs in walkways, pet beds, electrical cords, etc?: (Patient-Rptd) Yes Adequate lighting in your home to reduce risk of falls?: (Patient-Rptd) Yes Life alert?: (Patient-Rptd) No Use of a cane, walker or w/c?: (Patient-Rptd) Yes Grab bars in the bathroom?: (Patient-Rptd) Yes Shower chair or bench in shower?: (Patient-Rptd) No Elevated toilet seat or a handicapped toilet?: (Patient-Rptd) No  TIMED UP AND GO:  Was the test performed?  No,audio  Cognitive Function: 6CIT completed    06/29/2018   10:11 AM  MMSE - Mini Mental State Exam  Orientation to time 5  Orientation to Place 5  Registration 3  Attention/ Calculation 5  Recall 1  Language- name 2 objects 2  Language- repeat 1  Language-  follow 3 step command 3  Language- read & follow direction 1  Write a sentence 1  Copy design 1  Total score 28        12/26/2023    3:10 PM 12/20/2022    2:35 PM  6CIT Screen  What Year? 0 points 0 points  What month? 0 points 0 points  What time? 0 points 0 points  Count back from 20 0 points 0 points  Months in reverse 0 points 0 points  Repeat phrase 2 points 0 points  Total Score 2 points 0 points    Immunizations Immunization History  Administered Date(s) Administered   Influenza,inj,Quad PF,6+ Mos 03/23/2018, 08/15/2019   Tdap 06/29/2018    Screening Tests Health Maintenance  Topic Date Due   Cervical Cancer Screening (HPV/Pap Cotest)  Never done   MAMMOGRAM  08/25/2022   Medicare Annual Wellness (AWV)  12/20/2023   INFLUENZA VACCINE  01/27/2024   Colonoscopy  07/29/2025   DTaP/Tdap/Td (2 - Td or Tdap) 06/29/2028   Hepatitis C Screening  Completed   HIV Screening  Completed   Hepatitis B Vaccines  Aged Out   HPV VACCINES  Aged Out   Meningococcal B Vaccine  Aged Out   COVID-19 Vaccine  Discontinued   Zoster Vaccines- Shingrix  Discontinued    Health Maintenance  Health Maintenance Due  Topic Date Due   Cervical Cancer Screening (HPV/Pap Cotest)  Never done   MAMMOGRAM  08/25/2022   Medicare Annual Wellness (AWV)  12/20/2023   Health Maintenance Items Addressed: Pt declines future mammograms unless she notes any abnormalities.  Reports up to date with pap smear but can't remember doctor name at present.  Additional Screening:  Vision Screening: Recommended annual ophthalmology exams for early detection of glaucoma and other disorders of the eye. Would you like a referral to an eye doctor? No    Dental Screening: Recommended annual dental exams for proper oral hygiene  Community Resource Referral / Chronic Care Management: CRR required this visit?  No   CCM required this visit?  No   Plan:    I have personally reviewed and noted the  following in the patient's chart:   Medical and social history Use of alcohol, tobacco or illicit drugs  Current medications and supplements including opioid prescriptions. Patient is not currently taking opioid prescriptions. Functional ability and status Nutritional status Physical activity Advanced directives List of other physicians Hospitalizations, surgeries, and ER visits in previous 12 months Vitals Screenings to include cognitive, depression, and falls Referrals and appointments  In addition, I have reviewed and discussed with patient certain preventive protocols, quality metrics, and best practice recommendations. A written personalized care plan for preventive services as well as general preventive health recommendations were provided to patient.   Lolita Libra, CMA   12/26/2023   After Visit Summary: (MyChart) Due to this being a telephonic visit, the after visit summary with patients personalized plan was offered to patient via MyChart   Notes: Nothing significant to report at this time.

## 2023-12-26 NOTE — Patient Instructions (Signed)
 [name] , Thank you for taking time out of your busy schedule to complete your Annual Wellness Visit with me. I enjoyed our conversation and look forward to speaking with you again next year. I, as well as your care team,  appreciate your ongoing commitment to your health goals. Please review the following plan we discussed and let me know if I can assist you in the future. Your Game plan/ To Do List   Follow up Visits: Next Medicare AWV with our clinical staff: 12/27/24 3pm, telephone    Next Office Visit with your provider: 12/27/23 2:30pm, Physical. Please do not eat for 6-8 hrs prior and DO drink water up until you appointment.  Please bring the name of your current GYN to this appointment so we can update your record.  Clinician Recommendations:  Aim for 30 minutes of exercise or brisk walking, 6-8 glasses of water, and 5 servings of fruits and vegetables each day.       This is a list of the screening recommended for you and due dates:  Health Maintenance  Topic Date Due   Pap with HPV screening  Never done   Mammogram  12/25/2024*   Flu Shot  01/27/2024   Medicare Annual Wellness Visit  12/25/2024   Colon Cancer Screening  07/29/2025   DTaP/Tdap/Td vaccine (2 - Td or Tdap) 06/29/2028   Hepatitis C Screening  Completed   HIV Screening  Completed   Hepatitis B Vaccine  Aged Out   HPV Vaccine  Aged Out   Meningitis B Vaccine  Aged Out   COVID-19 Vaccine  Discontinued   Zoster (Shingles) Vaccine  Discontinued  *Topic was postponed. The date shown is not the original due date.    I am giving a packet of advanced directives to Dr Gena assistant to give you at your appointment tomorrow. Once completed and notarized, you may return a copy of your Advanced Directive(s) by either of the following:  Bring a copy of your health care power of attorney and living will to the office to be added to your chart at your convenience. You can mail a copy to [facility] [address] [place], [place] [address] or email to [email or web address]   Advance Care Planning is important because it:  [x]  Makes sure you receive the medical care that is consistent with your values, goals, and preferences  [x]  It provides guidance to your family and loved ones and reduces their decisional burden about whether or not they are making the right decisions based on your wishes.  Follow the link provided in your after visit summary or read over the paperwork we have mailed to you to help you started getting your Advance Directives in place. If you need assistance in completing these, please reach out to us  so that we can help you!  See attachments for Preventive Care and Fall Prevention Tips.

## 2023-12-27 ENCOUNTER — Ambulatory Visit: Admitting: Family Medicine

## 2023-12-27 VITALS — BP 132/86 | HR 71 | Temp 98.0°F | Resp 16 | Ht 69.0 in | Wt 142.8 lb

## 2023-12-27 DIAGNOSIS — E7800 Pure hypercholesterolemia, unspecified: Secondary | ICD-10-CM | POA: Diagnosis not present

## 2023-12-27 DIAGNOSIS — R300 Dysuria: Secondary | ICD-10-CM

## 2023-12-27 DIAGNOSIS — G8929 Other chronic pain: Secondary | ICD-10-CM

## 2023-12-27 DIAGNOSIS — M545 Low back pain, unspecified: Secondary | ICD-10-CM | POA: Diagnosis not present

## 2023-12-27 LAB — POC URINALSYSI DIPSTICK (AUTOMATED)
Bilirubin, UA: NEGATIVE
Blood, UA: NEGATIVE
Glucose, UA: NEGATIVE
Ketones, UA: NEGATIVE
Nitrite, UA: POSITIVE
Protein, UA: NEGATIVE
Spec Grav, UA: 1.02 (ref 1.010–1.025)
Urobilinogen, UA: 0.2 U/dL
pH, UA: 6 (ref 5.0–8.0)

## 2023-12-27 MED ORDER — IBUPROFEN 800 MG PO TABS: 800.0000 mg | ORAL_TABLET | Freq: Every evening | ORAL | 1 refills | Status: DC | PRN

## 2023-12-27 MED ORDER — CELECOXIB 100 MG PO CAPS: 100.0000 mg | ORAL_CAPSULE | Freq: Two times a day (BID) | ORAL | 1 refills | Status: DC

## 2023-12-27 MED ORDER — NITROFURANTOIN MONOHYD MACRO 100 MG PO CAPS: 100.0000 mg | ORAL_CAPSULE | Freq: Two times a day (BID) | ORAL | 0 refills | Status: AC

## 2023-12-27 MED ORDER — CELECOXIB 100 MG PO CAPS: 100.0000 mg | ORAL_CAPSULE | Freq: Every day | ORAL | Status: DC

## 2023-12-27 NOTE — Progress Notes (Signed)
 Chief Complaint  Patient presents with   Medication Refill    Medication Check     Brenda Curry is a 63 y.o. female here for possible UTI.  Duration: 2 days. Symptoms: Dysuria, urinary frequency, urinary retention, flank pain on left, and urgency Denies: hematuria, urinary hesitancy, fever, nausea, and vomiting, vaginal discharge Hx of recurrent UTI? No Denies new sexual partners.  Hx of chronic pain. Takes Celebrex  200 mg daily as needed. Also takes ibuprofen 800 mg nightly as needed. No AE's.   Past Medical History:  Diagnosis Date   Bilateral cataracts 12/26/2023   monitoring   Chicken pox    Frequent headaches    History of fainting spells of unknown cause    Hypertension    Migraines    Urinary tract infection      BP 132/86 (BP Location: Left Arm, Patient Position: Sitting)   Pulse 71   Temp 98 F (36.7 C) (Oral)   Resp 16   Ht 5' 9 (1.753 m)   Wt 142 lb 12.8 oz (64.8 kg)   LMP  (LMP Unknown)   SpO2 98%   BMI 21.09 kg/m  General: Awake, alert, appears stated age Heart: RRR Lungs: CTAB, normal respiratory effort, no accessory muscle usage Abd: BS+, soft, NT, ND, no masses or organomegaly MSK: No CVA tenderness, neg Lloyd's sign Psych: Age appropriate judgment and insight  Chronic bilateral low back pain without sciatica - Plan: celecoxib  (CELEBREX ) 100 MG capsule, DISCONTINUED: celecoxib  (CELEBREX ) 100 MG capsule  Pure hypercholesterolemia - Plan: CBC, Comprehensive metabolic panel with GFR, Lipid panel  Dysuria  Chronic, stable. Cont Celebrex  200 mg every day prn. Ibuprofen 800 mg qhs prn. Don't take at same time. Ck labs.  Tx empirically w 5 d of Macrobid . Stay hydrated. Seek immediate care if pt starts to develop fevers, new/worsening symptoms, uncontrollable N/V. F/u in 1 yr or prn.  The patient voiced understanding and agreement to the plan.  Brenda Mt [place], DO 12/27/23 2:41 PM

## 2023-12-27 NOTE — Patient Instructions (Signed)
 Give us  2-3 business days to get the results of your labs back.   Keep the diet clean and stay active.  Do not take the ibuprofen and the Celebrex  at the same time.  Let us  know if you need anything.

## 2023-12-27 NOTE — Addendum Note (Signed)
 Addended by: Raylin Winer M on: 12/27/2023 03:20 PM   Modules accepted: Orders

## 2023-12-28 ENCOUNTER — Ambulatory Visit: Payer: Self-pay | Admitting: Family Medicine

## 2023-12-28 LAB — COMPREHENSIVE METABOLIC PANEL WITH GFR
ALT: 13 U/L (ref 0–35)
AST: 16 U/L (ref 0–37)
Albumin: 4.5 g/dL (ref 3.5–5.2)
Alkaline Phosphatase: 63 U/L (ref 39–117)
BUN: 14 mg/dL (ref 6–23)
CO2: 27 meq/L (ref 19–32)
Calcium: 9.8 mg/dL (ref 8.4–10.5)
Chloride: 102 meq/L (ref 96–112)
Creatinine, Ser: 0.66 mg/dL (ref 0.40–1.20)
GFR: 93.98 mL/min (ref >60.00)
Glucose, Bld: 96 mg/dL (ref 70–99)
Potassium: 3.7 meq/L (ref 3.5–5.1)
Sodium: 139 meq/L (ref 135–145)
Total Bilirubin: 0.5 mg/dL (ref 0.2–1.2)
Total Protein: 6.8 g/dL (ref 6.0–8.3)

## 2023-12-28 LAB — CBC
HCT: 41.9 % (ref 36.0–46.0)
Hemoglobin: 13.7 g/dL (ref 12.0–15.0)
MCHC: 32.7 g/dL (ref 30.0–36.0)
MCV: 86.8 fl (ref 78.0–100.0)
Platelets: 233 10*3/uL (ref 150.0–400.0)
RBC: 4.83 Mil/uL (ref 3.87–5.11)
RDW: 13.8 % (ref 11.5–15.5)
WBC: 6.7 10*3/uL (ref 4.0–10.5)

## 2023-12-28 LAB — LIPID PANEL
Cholesterol: 207 mg/dL — ABNORMAL HIGH (ref 0–200)
HDL: 71.8 mg/dL (ref >39.00)
LDL Cholesterol: 113 mg/dL — ABNORMAL HIGH (ref 0–99)
NonHDL: 134.87
Total CHOL/HDL Ratio: 3
Triglycerides: 108 mg/dL (ref 0.0–149.0)
VLDL: 21.6 mg/dL (ref 0.0–40.0)

## 2023-12-29 LAB — URINE CULTURE
MICRO NUMBER:: 16646637
SPECIMEN QUALITY:: ADEQUATE

## 2024-02-16 NOTE — Progress Notes (Deleted)
 Cardiology Office Note:    Date:  02/16/2024   ID:  Brenda Curry, DOB [birth year], MRN [id number]  PCP:  Frann Mabel Mt, DO  Cardiologist:  Redell Leiter, MD    Referring MD: Frann Mabel Mt*    ASSESSMENT:    No diagnosis found. PLAN:    In order of problems listed above:  ***   Next appointment: ***   Medication Adjustments/Labs and Tests Ordered: Current medicines are reviewed at length with the patient today.  Concerns regarding medicines are outlined above.  No orders of the defined types were placed in this encounter.  No orders of the defined types were placed in this encounter.    History of Present Illness:    Brenda Curry is a 63 y.o. female with a hx of adrenal insufficiency orthostatic hypotension with syncope requiring alpha agonist support and symptomatic PVCs detected with smart watch last seen 05/13/2022. Compliance with diet, lifestyle and medications: *** Past Medical History:  Diagnosis Date   Bilateral cataracts 12/26/2023   monitoring   Chicken pox    Frequent headaches    History of fainting spells of unknown cause    Hypertension    Migraines    Urinary tract infection     Current Medications: No outpatient medications have been marked as taking for the 02/20/24 encounter (Appointment) with Leiter Redell PARAS, MD.      EKGs/Labs/Other Studies Reviewed:    The following studies were reviewed today:  Cardiac Studies & Procedures   ______________________________________________________________________________________________     ECHOCARDIOGRAM  ECHOCARDIOGRAM COMPLETE 08/06/2019  Narrative ECHOCARDIOGRAM REPORT    Patient Name:   Brenda Curry Date of Exam: 08/06/2019 Medical Rec #:  [id number]        Height:       69.0 in Accession #:    [id number]       Weight:       148.0 lb Date of Birth:  11/02/1960         BSA:          1.82 m Patient Age:    63 years         BP:           120/82  mmHg Patient Gender: F                HR:           58 bpm. Exam Location:  [place]  Procedure: 2D Echo, Cardiac Doppler, Color Doppler and Strain Analysis  Indications:    Syncope  History:        Patient has no prior history of Echocardiogram examinations.  Sonographer:    Fairy Canton RDCS (AE) Referring Phys: [id number] Brenda Curry  IMPRESSIONS   1. Left ventricular ejection fraction, by estimation, is 60 to 65%. The left ventricle has normal function. Left ventricular diastolic parameters are consistent with Grade I diastolic dysfunction (impaired relaxation). 2. Right ventricular systolic function is normal. There is normal pulmonary artery systolic pressure. 3. The aortic valve is tricuspid.  Conclusion(s)/Recomendation(s): Normal biventricular function without evidence of hemodynamically significant valvular heart disease.  FINDINGS Left Ventricle: Left ventricular ejection fraction, by estimation, is 60 to 65%. The left ventricle has normal function. The left ventricle has no regional wall motion abnormalities. The left ventricular internal cavity size was normal in size. There is borderline left ventricular hypertrophy. Normal left ventricular filling pressure.  Right Ventricle: The right ventricular size is normal. No increase in right ventricular wall  thickness. Right ventricular systolic function is normal. There is normal pulmonary artery systolic pressure. The tricuspid regurgitant velocity is 2.56 m/s, and with an assumed right atrial pressure of 3 mmHg, the estimated right ventricular systolic pressure is 29.2 mmHg.  Left Atrium: Left atrial size was normal in size.  Right Atrium: Right atrial size was normal in size.  Pericardium: There is no evidence of pericardial effusion.  Mitral Valve: The mitral valve is normal in structure and function. Normal mobility of the mitral valve leaflets. Trivial mitral valve regurgitation. No evidence of mitral valve  stenosis.  Tricuspid Valve: The tricuspid valve is normal in structure. Tricuspid valve regurgitation is trivial. No evidence of tricuspid stenosis.  Aortic Valve: The aortic valve is tricuspid. Aortic valve regurgitation is not visualized. No aortic stenosis is present.  Pulmonic Valve: The pulmonic valve was normal in structure. Pulmonic valve regurgitation is not visualized. No evidence of pulmonic stenosis.  Aorta: The aortic root and ascending aorta are structurally normal, with no evidence of dilitation.  Pulmonary Artery: The pulmonary artery is of normal size.  Venous: A normal flow pattern is recorded from the right upper pulmonary vein. The inferior vena cava is normal in size with greater than 50% respiratory variability, suggesting right atrial pressure of 3 mmHg.  IAS/Shunts: No atrial level shunt detected by color flow Doppler.   LEFT VENTRICLE PLAX 2D LVIDd:         4.88 cm  Diastology LVIDs:         2.86 cm  LV e' lateral:   9.36 cm/s LV PW:         1.06 cm  LV E/e' lateral: 8.7 LV IVS:        1.13 cm  LV e' medial:    7.29 cm/s LVOT diam:     1.70 cm  LV E/e' medial:  11.1 LV SV:         41.54 ml LV SV Index:   44.61 LVOT Area:     2.27 cm   RIGHT VENTRICLE             IVC RV S prime:     12.80 cm/s  IVC diam: 1.66 cm TAPSE (M-mode): 2.6 cm  LEFT ATRIUM             Index       RIGHT ATRIUM           Index LA diam:        2.70 cm 1.49 cm/m  RA Area:     12.40 cm LA Vol (A2C):   25.0 ml 13.75 ml/m RA Volume:   27.80 ml  15.29 ml/m LA Vol (A4C):   32.3 ml 17.77 ml/m LA Biplane Vol: 29.4 ml 16.17 ml/m AORTIC VALVE LVOT Vmax:   77.70 cm/s LVOT Vmean:  55.000 cm/s LVOT VTI:    0.183 m  AORTA Ao Root diam: 3.10 cm Ao Asc diam:  3.20 cm  MITRAL VALVE                        TRICUSPID VALVE MV Area (PHT): 3.74 cm             TR Peak grad:   26.2 mmHg MV Decel Time: 203 msec             TR Vmax:        256.00 cm/s MV E velocity: 81.00 cm/s 103 cm/s MV  A velocity: 77.10 cm/s 70.3 cm/s  SHUNTS MV E/A ratio:  1.05       1.5       Systemic VTI:  0.18 m Systemic Diam: 1.70 cm  Redell Leiter MD Electronically signed by Redell Leiter MD Signature Date/Time: 08/06/2019/12:52:05 PM    Final    MONITORS  LONG TERM MONITOR-LIVE TELEMETRY (3-14 DAYS) 11/28/2020  Narrative Patch Wear Time:  13 days and 21 hours (2022-05-11T14:35:55-0400 to 2022-05-25T12:20:11-398)  Patient had a min HR of 47 bpm, max HR of 158 bpm, and avg HR of 74 bpm. Predominant underlying rhythm was Sinus Rhythm. 1 run of Supraventricular Tachycardia occurred lasting 4 beats with a max rate of 126 bpm (avg 118 bpm). Isolated SVEs were rare (<1.0%), SVE Couplets were rare (<1.0%), and no SVE Triplets were present. Isolated VEs were rare (<1.0%, 1461), VE Triplets were rare (<1.0%, 1), and no VE Couplets were present. Ventricular Bigeminy and Trigeminy were present.  There were 3 triggered events 1 with single PVCs and 2 with sinus tachycardia There were no pauses of 3 seconds or greater no episodes of second or third-degree AV nodal or sinus node exit block. Ventricular ectopy was rare Supraventricular ectopy was rare there were no episodes of atrial fibrillation or flutter  Conclusion unremarkable event monitor.  One of the triggered events was associated with PVCs.       ______________________________________________________________________________________________          Recent Labs: 12/27/2023: ALT 13; BUN 14; Creatinine, Ser 0.66; Hemoglobin 13.7; Platelets 233.0; Potassium 3.7; Sodium 139  Recent Lipid Panel    Component Value Date/Time   CHOL 207 (H) 12/27/2023 1449   TRIG 108.0 12/27/2023 1449   HDL 71.80 12/27/2023 1449   CHOLHDL 3 12/27/2023 1449   VLDL 21.6 12/27/2023 1449   LDLCALC 113 (H) 12/27/2023 1449    Physical Exam:    VS:  LMP  (LMP Unknown)     Wt Readings from Last 3 Encounters:  12/27/23 142 lb 12.8 oz (64.8 kg)  12/26/23 143 lb (64.9  kg)  06/15/22 143 lb (64.9 kg)     GEN: *** Well nourished, well developed in no acute distress HEENT: Normal NECK: No JVD; No carotid bruits LYMPHATICS: No lymphadenopathy CARDIAC: ***RRR, no murmurs, rubs, gallops RESPIRATORY:  Clear to auscultation without rales, wheezing or rhonchi  ABDOMEN: Soft, non-tender, non-distended MUSCULOSKELETAL:  No edema; No deformity  SKIN: Warm and dry NEUROLOGIC:  Alert and oriented x 3 PSYCHIATRIC:  Normal affect    Signed, Redell Leiter, MD  02/16/2024 9:34 AM    [facility]

## 2024-02-17 DIAGNOSIS — I10 Essential (primary) hypertension: Secondary | ICD-10-CM | POA: Insufficient documentation

## 2024-02-20 ENCOUNTER — Ambulatory Visit: Admitting: Cardiology

## 2024-02-20 DIAGNOSIS — E271 Primary adrenocortical insufficiency: Secondary | ICD-10-CM

## 2024-02-20 DIAGNOSIS — I493 Ventricular premature depolarization: Secondary | ICD-10-CM

## 2024-02-20 DIAGNOSIS — I951 Orthostatic hypotension: Secondary | ICD-10-CM

## 2024-05-30 DIAGNOSIS — R200 Anesthesia of skin: Secondary | ICD-10-CM | POA: Diagnosis not present

## 2024-06-22 ENCOUNTER — Other Ambulatory Visit: Payer: Self-pay | Admitting: Family Medicine

## 2024-06-22 DIAGNOSIS — M545 Low back pain, unspecified: Secondary | ICD-10-CM

## 2024-06-28 ENCOUNTER — Other Ambulatory Visit: Payer: Self-pay | Admitting: Family Medicine

## 2024-12-26 ENCOUNTER — Ambulatory Visit: Admitting: Family Medicine

## 2024-12-27 ENCOUNTER — Ambulatory Visit
